# Patient Record
Sex: Female | Born: 1955 | Hispanic: No | Marital: Married | Smoking: Never smoker
Health system: Southern US, Community
[De-identification: ages and names within clinical notes are randomized; demographics above are authoritative.]

## PROBLEM LIST (undated history)

## (undated) DIAGNOSIS — G500 Trigeminal neuralgia: Secondary | ICD-10-CM

## (undated) DIAGNOSIS — R519 Headache, unspecified: Secondary | ICD-10-CM

## (undated) DIAGNOSIS — I10 Essential (primary) hypertension: Secondary | ICD-10-CM

## (undated) HISTORY — DX: Essential (primary) hypertension: I10

## (undated) HISTORY — PX: VAGINAL HYSTERECTOMY: SUR661

## (undated) HISTORY — DX: Trigeminal neuralgia: G50.0

## (undated) HISTORY — DX: Headache, unspecified: R51.9

---

## 1997-11-26 ENCOUNTER — Other Ambulatory Visit: Admission: RE | Admit: 1997-11-26 | Discharge: 1997-11-26 | Payer: Self-pay | Admitting: Obstetrics & Gynecology

## 1999-10-19 ENCOUNTER — Other Ambulatory Visit: Admission: RE | Admit: 1999-10-19 | Discharge: 1999-10-19 | Payer: Self-pay | Admitting: Obstetrics & Gynecology

## 2003-05-20 ENCOUNTER — Other Ambulatory Visit: Admission: RE | Admit: 2003-05-20 | Discharge: 2003-05-20 | Payer: Self-pay | Admitting: Obstetrics & Gynecology

## 2004-04-28 ENCOUNTER — Inpatient Hospital Stay (HOSPITAL_COMMUNITY): Admission: RE | Admit: 2004-04-28 | Discharge: 2004-04-30 | Payer: Self-pay | Admitting: Obstetrics & Gynecology

## 2006-01-10 ENCOUNTER — Ambulatory Visit: Payer: Self-pay | Admitting: Internal Medicine

## 2008-12-21 ENCOUNTER — Encounter: Admission: RE | Admit: 2008-12-21 | Discharge: 2008-12-21 | Payer: Self-pay | Admitting: Family Medicine

## 2009-02-23 ENCOUNTER — Encounter: Admission: RE | Admit: 2009-02-23 | Discharge: 2009-02-23 | Payer: Self-pay | Admitting: Family Medicine

## 2011-04-12 ENCOUNTER — Other Ambulatory Visit: Payer: Self-pay | Admitting: Family Medicine

## 2011-04-12 ENCOUNTER — Ambulatory Visit
Admission: RE | Admit: 2011-04-12 | Discharge: 2011-04-12 | Disposition: A | Discharge status: Home / Self Care | Payer: Managed Care, Other (non HMO) | Source: Ambulatory Visit | Attending: Family Medicine | Admitting: Family Medicine

## 2011-04-12 DIAGNOSIS — T1490XA Injury, unspecified, initial encounter: Secondary | ICD-10-CM

## 2012-04-09 ENCOUNTER — Ambulatory Visit (INDEPENDENT_AMBULATORY_CARE_PROVIDER_SITE_OTHER): Payer: Managed Care, Other (non HMO) | Admitting: Diagnostic Neuroimaging

## 2012-04-09 ENCOUNTER — Encounter: Payer: Self-pay | Admitting: Diagnostic Neuroimaging

## 2012-04-09 VITALS — BP 142/74 | HR 56 | Temp 98.7°F | Ht 64.0 in | Wt 170.0 lb

## 2012-04-09 DIAGNOSIS — G500 Trigeminal neuralgia: Secondary | ICD-10-CM

## 2012-04-09 MED ORDER — CARBAMAZEPINE ER 200 MG PO TB12: 600.0000 mg | ORAL_TABLET | Freq: Two times a day (BID) | ORAL | Status: DC

## 2012-04-09 MED ORDER — BACLOFEN 10 MG PO TABS: 10.0000 mg | ORAL_TABLET | Freq: Three times a day (TID) | ORAL | Status: DC

## 2012-04-09 NOTE — Patient Instructions (Signed)
Continue carbamazepine. Start baclofen dosing as well.

## 2012-04-09 NOTE — Progress Notes (Signed)
[facility]  PATIENT: Kathleen [name] DOB: 30-Jul-1955  REFERRING CLINICIAN:  HISTORY FROM: patient REASON FOR VISIT: follow up   HISTORICAL  CHIEF COMPLAINT:  Chief Complaint  Patient presents with  . Facial Pain    Trigeminal Neuralgia    HISTORY OF PRESENT ILLNESS:   UPDATE 04/09/12: Since last visit, doing about the same. Has not tried baclofen yet. Taking carbamazepine 600 mg twice a day. Still having some mild twinges of numbness and tingling on the right upper lip. No severe painful attacks.  UPDATE 09/04/11: Doing better. Tolerating CBZ 600mg  BID. Uses baclofen prn. No side effects. Mild twinges of aches, but no severe pain.   UPDATE 05/01/11: Doing well. Better on CBZ. Mild cognitive side effects. Fell 4 weeks ago from [employer], broke left foot, but doesn't think it was medication related.   PRIOR HPI: 56 year old right-handed female with history of hypertension, migraine headache, here for evaluation of right facial pain.  Patient has had intermittent episodes of electrical and lightening, shooting pain in her right jaw for over 10 years.  Previous episodes lasted for only a few weeks at a time.  She describes brief, electrical, radiating flashes of pain in her right jaw, up towards her right eye, and deeper in her neck and inside her ear.  She is been evaluated by her dentist numerous times over the years without a specific dental pathology to explain these symptoms.  She does have a history of dental procedures and crowns on her right side.  Since 02/04/2011, patient's symptoms have been more severe and persistent.  Has tried hydrocodone, gabapentin, without relief. She denies any numbness, weakness, slurred speech, vision problems.  She has a first cousin who was diagnosed with multiple sclerosis and is deceased now.  REVIEW OF SYSTEMS: Full 14 system review of systems performed and notable only for weight gain, murmur, ringing in the ears, skin moles,  blurred vision, headache.  ALLERGIES: No Known Allergies  HOME MEDICATIONS: No outpatient prescriptions prior to visit.   No facility-administered medications prior to visit.    PAST MEDICAL HISTORY: Past Medical History  Diagnosis Date  . Hypertension     PAST SURGICAL HISTORY: History reviewed. No pertinent past surgical history.  FAMILY HISTORY: Family History  Problem Relation Age of Onset  . Atrial fibrillation Mother   . Hypertension Mother   . Arthritis Mother   . Heart attack Father   . Bone cancer Other     SOCIAL HISTORY:  History   Social History  . Marital Status: Divorced    Spouse Name: N/A    Number of Children: 1  . Years of Education: HS   Occupational History  .      [employer]   Social History Main Topics  . Smoking status: Never Smoker   . Smokeless tobacco: Not on file  . Alcohol Use: No  . Drug Use: No  . Sexually Active: Not on file   Other Topics Concern  . Not on file   Social History Narrative   Caffeine Use- She drinks coffee and sodas on occasion.     PHYSICAL EXAM  Filed Vitals:   04/09/12 1549  BP: 142/74  Pulse: 56  Temp: 98.7 F (37.1 C)  TempSrc: Oral  Height: 5\' 4"  (1.626 m)  Weight: 170 lb (77.111 kg)   Body mass index is 29.17 kg/(m^2).  GENERAL EXAM: Patient is in no distress  CARDIOVASCULAR: Regular rate and rhythm, no murmurs, no carotid bruits  NEUROLOGIC: MENTAL STATUS: awake, alert, language fluent, comprehension intact, naming intact CRANIAL NERVE: no papilledema on fundoscopic exam, pupils equal and reactive to light, visual fields full to confrontation, extraocular muscles intact, no nystagmus, facial sensation and strength symmetric, uvula midline, shoulder shrug symmetric, tongue midline. MOTOR: normal bulk and tone, full strength in the BUE, BLE SENSORY: normal and symmetric to light touch, pinprick, temperature, vibration and proprioception COORDINATION: finger-nose-finger, fine  finger movements normal REFLEXES: deep tendon reflexes present and symmetric GAIT/STATION: narrow based gait; able to walk on toes, heels and tandem; romberg is negative   DIAGNOSTIC DATA (LABS, IMAGING, TESTING) - I reviewed patient records, labs, notes, testing and imaging myself where available.  No results found for this basename: WBC, HGB, HCT, MCV, PLT   No results found for this basename: na, k, cl, co2, glucose, bun, creatinine, calcium, prot, albumin, ast, alt, alkphos, bilitot, gfrnonaa, gfraa   No results found for this basename: CHOL, HDL, LDLCALC, LDLDIRECT, TRIG, CHOLHDL   No results found for this basename: HGBA1C   No results found for this basename: VITAMINB12   No results found for this basename: TSH   03/01/11 MRI BRAIN - normal   ASSESSMENT AND PLAN  57 y.o. year old female  has a past medical history of Hypertension. here with right trigeminal neuralgia. Doing better on CBZ. Will encourage her to try baclofen to see if symptom management can be improved.   Suanne Marker, MD 04/09/2012, 4:16 PM Certified in Neurology, Neurophysiology and Neuroimaging  [facility] [address] [place], [place] [address] [phone]

## 2012-04-12 ENCOUNTER — Other Ambulatory Visit: Payer: Self-pay | Admitting: Diagnostic Neuroimaging

## 2012-10-04 ENCOUNTER — Other Ambulatory Visit: Payer: Self-pay

## 2012-10-04 DIAGNOSIS — Z1231 Encounter for screening mammogram for malignant neoplasm of breast: Secondary | ICD-10-CM

## 2012-10-22 ENCOUNTER — Telehealth: Payer: Self-pay

## 2012-10-22 NOTE — Telephone Encounter (Signed)
Called patient rescheduled 10/23/12 appt from 2:30 to 1:30. Pt agreed.

## 2012-10-23 ENCOUNTER — Encounter: Payer: Self-pay | Admitting: Diagnostic Neuroimaging

## 2012-10-23 ENCOUNTER — Ambulatory Visit (INDEPENDENT_AMBULATORY_CARE_PROVIDER_SITE_OTHER): Payer: Managed Care, Other (non HMO) | Admitting: Diagnostic Neuroimaging

## 2012-10-23 ENCOUNTER — Ambulatory Visit: Payer: Managed Care, Other (non HMO) | Admitting: Diagnostic Neuroimaging

## 2012-10-23 ENCOUNTER — Encounter (INDEPENDENT_AMBULATORY_CARE_PROVIDER_SITE_OTHER): Payer: Self-pay

## 2012-10-23 VITALS — BP 125/79 | HR 59 | Temp 98.1°F | Ht 64.5 in | Wt 172.0 lb

## 2012-10-23 DIAGNOSIS — G500 Trigeminal neuralgia: Secondary | ICD-10-CM

## 2012-10-23 MED ORDER — CARBAMAZEPINE ER 200 MG PO TB12: 600.0000 mg | ORAL_TABLET | Freq: Two times a day (BID) | ORAL | Status: DC

## 2012-10-23 NOTE — Progress Notes (Signed)
[facility]  PATIENT: Kathleen [name] DOB: Jan 18, 1955  REFERRING CLINICIAN:  HISTORY FROM: patient REASON FOR VISIT: follow up   HISTORICAL  CHIEF COMPLAINT:  Chief Complaint  Patient presents with  . Follow-up    Trigeminal neuralgia # 6    HISTORY OF PRESENT ILLNESS:   UPDATE 10/23/12: Since last visit, doing abou the same. Baclofen 10mg  BID did not help that much more than carbamazepine 600mg  BID alone. Migraines worse for a while, but better with starting HCTZ and reducing Excedrin use.   UPDATE 04/09/12: Since last visit, doing about the same. Has not tried baclofen yet. Taking carbamazepine 600 mg twice a day. Still having some mild twinges of numbness and tingling on the right upper lip. No severe painful attacks.  UPDATE 09/04/11: Doing better. Tolerating CBZ 600mg  BID. Uses baclofen prn. No side effects. Mild twinges of aches, but no severe pain.   UPDATE 05/01/11: Doing well. Better on CBZ. Mild cognitive side effects. Fell 4 weeks ago from [employer], broke left foot, but doesn't think it was medication related.   PRIOR HPI: 57 year old right-handed female with history of hypertension, migraine headache, here for evaluation of right facial pain.  Patient has had intermittent episodes of electrical and lightening, shooting pain in her right jaw for over 10 years.  Previous episodes lasted for only a few weeks at a time.  She describes brief, electrical, radiating flashes of pain in her right jaw, up towards her right eye, and deeper in her neck and inside her ear.  She is been evaluated by her dentist numerous times over the years without a specific dental pathology to explain these symptoms.  She does have a history of dental procedures and crowns on her right side.  Since 02/04/2011, patient's symptoms have been more severe and persistent.  Has tried hydrocodone, gabapentin, without relief. She denies any numbness, weakness, slurred speech, vision problems.   She has a first cousin who was diagnosed with multiple sclerosis and is deceased now.  REVIEW OF SYSTEMS: Full 14 system review of systems performed and notable only for headaches, right facial pain.  ALLERGIES: Allergies  Allergen Reactions  . Morphine And Related   . Penicillins     HOME MEDICATIONS: Outpatient Prescriptions Prior to Visit  Medication Sig Dispense Refill  . Biotin 1000 MCG tablet Take 1,000 mcg by mouth daily.       . calcium carbonate (OS-CAL) 600 MG TABS Take 600 mg by mouth 2 (two) times daily with a meal.      . HYDROcodone-acetaminophen (NORCO/VICODIN) 5-325 MG per tablet as needed.      . nadolol (CORGARD) 40 MG tablet Take 1 tablet by mouth daily.      . Omega-3 Fatty Acids (FISH OIL) 1200 MG CAPS Take by mouth.      . SUMAtriptan (IMITREX) 100 MG tablet as needed.      . Vitamin D, Ergocalciferol, (DRISDOL) 50000 UNITS CAPS Take 50,000 Units by mouth once a week.      . baclofen (LIORESAL) 10 MG tablet Take 1 tablet (10 mg total) by mouth 3 (three) times daily.  90 each  12  . carbamazepine (TEGRETOL XR) 200 MG 12 hr tablet TAKE THREE TABLETS  BY MOUTH TWICE DAILY  180 tablet  12   No facility-administered medications prior to visit.    PAST MEDICAL HISTORY: Past Medical History  Diagnosis Date  . Hypertension   . Trigeminal neuralgia     PAST SURGICAL HISTORY:  No past surgical history on file.  FAMILY HISTORY: Family History  Problem Relation Age of Onset  . Atrial fibrillation Mother   . Hypertension Mother   . Arthritis Mother   . Heart attack Father   . Bone cancer Other     SOCIAL HISTORY:  History   Social History  . Marital Status: Divorced    Spouse Name: N/A    Number of Children: 1  . Years of Education: HS   Occupational History  .      [employer]   Social History Main Topics  . Smoking status: Never Smoker   . Smokeless tobacco: Not on file  . Alcohol Use: No  . Drug Use: No  . Sexual Activity: No   Other  Topics Concern  . Not on file   Social History Narrative   Caffeine Use- She drinks coffee and sodas on occasion.     PHYSICAL EXAM  Filed Vitals:   10/23/12 1253  BP: 125/79  Pulse: 59  Temp: 98.1 F (36.7 C)  TempSrc: Oral  Height: 5' 4.5" (1.638 m)  Weight: 172 lb (78.019 kg)   Body mass index is 29.08 kg/(m^2).  GENERAL EXAM: Patient is in no distress  CARDIOVASCULAR: Regular rate and rhythm, no murmurs, no carotid bruits  NEUROLOGIC: MENTAL STATUS: awake, alert, language fluent, comprehension intact, naming intact CRANIAL NERVE: pupils equal and reactive to light, visual fields full to confrontation, extraocular muscles intact, no nystagmus, facial sensation and strength symmetric, uvula midline, shoulder shrug symmetric, tongue midline. MOTOR: normal bulk and tone, full strength in the BUE, BLE SENSORY: normal and symmetric to light touch COORDINATION: finger-nose-finger, fine finger movements normal GAIT/STATION: narrow based gait   DIAGNOSTIC DATA (LABS, IMAGING, TESTING) - I reviewed patient records, labs, notes, testing and imaging myself where available.  No results found for this basename: WBC,  HGB,  HCT,  MCV,  PLT   No results found for this basename: na,  k,  cl,  co2,  glucose,  bun,  creatinine,  calcium,  prot,  albumin,  ast,  alt,  alkphos,  bilitot,  gfrnonaa,  gfraa   No results found for this basename: CHOL,  HDL,  LDLCALC,  LDLDIRECT,  TRIG,  CHOLHDL   No results found for this basename: HGBA1C   No results found for this basename: VITAMINB12   No results found for this basename: TSH    03/01/11 MRI BRAIN - normal   ASSESSMENT AND PLAN  57 y.o. year old female  has a past medical history of Hypertension and Trigeminal neuralgia. here with right trigeminal neuralgia and migraine headaches.   PLAN: 1. Continue CBZ 600mg  BID 2. Monitor migraines; may consider topiramate or amitriptyline if they get worse  Return in about 6 months  (around 04/23/2013) for with Heide Guile or Penumalli.   Suanne Marker, MD 10/23/2012, 1:30 PM Certified in Neurology, Neurophysiology and Neuroimaging  [facility] [address] [place], [place] [address] [phone]

## 2012-10-23 NOTE — Patient Instructions (Signed)
Taper baclofen off because it is not helping.

## 2012-10-25 ENCOUNTER — Ambulatory Visit
Admission: RE | Admit: 2012-10-25 | Discharge: 2012-10-25 | Disposition: A | Discharge status: Home / Self Care | Payer: Managed Care, Other (non HMO) | Source: Ambulatory Visit

## 2012-10-25 DIAGNOSIS — Z1231 Encounter for screening mammogram for malignant neoplasm of breast: Secondary | ICD-10-CM

## 2013-04-24 ENCOUNTER — Ambulatory Visit (INDEPENDENT_AMBULATORY_CARE_PROVIDER_SITE_OTHER): Payer: Managed Care, Other (non HMO) | Admitting: Nurse Practitioner

## 2013-04-24 ENCOUNTER — Encounter: Payer: Self-pay | Admitting: Nurse Practitioner

## 2013-04-24 ENCOUNTER — Encounter (INDEPENDENT_AMBULATORY_CARE_PROVIDER_SITE_OTHER): Payer: Self-pay

## 2013-04-24 VITALS — BP 127/78 | HR 71 | Wt 188.0 lb

## 2013-04-24 DIAGNOSIS — G500 Trigeminal neuralgia: Secondary | ICD-10-CM

## 2013-04-24 DIAGNOSIS — G43109 Migraine with aura, not intractable, without status migrainosus: Secondary | ICD-10-CM | POA: Insufficient documentation

## 2013-04-24 MED ORDER — TOPIRAMATE 25 MG PO TABS: 25.0000 mg | ORAL_TABLET | Freq: Every day | ORAL | Status: DC

## 2013-04-24 NOTE — Patient Instructions (Signed)
Continue Carbamazepine at current dose.  Start Topirimate for Migraine Prevention, 25 mg , 1 tablet daily at bedtime.  Side effects may be tingling in fingers, around mouth, or in feet.  There may be decreased appetite and carbinated drinks may taste funny.  If the side effects are not tolerable, you may discontinue this dose without tapering.  Please keep headache diary and bring to the next visit. Follow up in 2 months, sooner as needed.

## 2013-04-24 NOTE — Progress Notes (Signed)
PATIENT: Kathleen MunroeDebbie S [name] DOB: 02/21/1955  REASON FOR VISIT: follow up for trigeminal neuralgia, Migraine HISTORY FROM: patient  HISTORY OF PRESENT ILLNESS: UPDATE 04/24/13 (LL):  Since last visit, trigeminal neuralgia is stable on CBZ, but when dose is wearing off she feels "vibration" sensation sometimes on right side of mouth.  Migraines are worse, averaging 3-4 days per week.  Sometimes wakes up with one in morning, sometimes comes on later in day.  Most often bi-temporal or behind eyes.  Sometimes in occipital region.  At worst with nausea and vomiting.  Positive for hyperosmia.  Migraines last 4-6 hours.  Sometimes relived with Excedrin.  Imitrex will stop severe headache in usually an hour.    UPDATE 10/23/12: Since last visit, doing about the same. Baclofen 10mg  BID did not help that much more than carbamazepine 600mg  BID alone. Migraines worse for a while, but better with starting HCTZ and reducing Excedrin use.  UPDATE 04/09/12: Since last visit, doing about the same. Has not tried baclofen yet. Taking carbamazepine 600 mg twice a day. Still having some mild twinges of numbness and tingling on the right upper lip. No severe painful attacks.  UPDATE 09/04/11: Doing better. Tolerating CBZ 600mg  BID. Uses baclofen prn. No side effects. Mild twinges of aches, but no severe pain.  UPDATE 05/01/11: Doing well. Better on CBZ. Mild cognitive side effects. Fell 4 weeks ago from [employer], broke left foot, but doesn't think it was medication related.  PRIOR HPI: 58 year old right-handed female with history of hypertension, migraine headache, here for evaluation of right facial pain.  Patient has had intermittent episodes of electrical and lightening, shooting pain in her right jaw for over 10 years. Previous episodes lasted for only a few weeks at a time. She describes brief, electrical, radiating flashes of pain in her right jaw, up towards her right eye, and deeper in her neck and inside her ear. She is  been evaluated by her dentist numerous times over the years without a specific dental pathology to explain these symptoms. She does have a history of dental procedures and crowns on her right side.  Since 02/04/2011, patient's symptoms have been more severe and persistent. Has tried hydrocodone, gabapentin, without relief. She denies any numbness, weakness, slurred speech, vision problems. She has a first cousin who was diagnosed with multiple sclerosis and is deceased now.   REVIEW OF SYSTEMS: Full 14 system review of systems performed and notable only for headaches, weight gain, ringing in ears, joint pain.   ALLERGIES: Allergies  Allergen Reactions  . Morphine And Related   . Penicillins     HOME MEDICATIONS: Outpatient Prescriptions Prior to Visit  Medication Sig Dispense Refill  . Biotin 1000 MCG tablet Take 1,000 mcg by mouth daily.       . calcium carbonate (OS-CAL) 600 MG TABS Take 600 mg by mouth 2 (two) times daily with a meal.      . carbamazepine (TEGRETOL XR) 200 MG 12 hr tablet Take 3 tablets (600 mg total) by mouth 2 (two) times daily.  180 tablet  12  . hydrochlorothiazide (MICROZIDE) 12.5 MG capsule Take 12.5 mg by mouth daily.      . nadolol (CORGARD) 40 MG tablet Take 1 tablet by mouth daily.      . Omega-3 Fatty Acids (FISH OIL) 1200 MG CAPS Take by mouth.      . SUMAtriptan (IMITREX) 100 MG tablet as needed.      . baclofen (LIORESAL) 10 MG tablet  Take 10 mg by mouth 2 (two) times daily.      Marland Kitchen. HYDROcodone-acetaminophen (NORCO/VICODIN) 5-325 MG per tablet as needed.      . Vitamin D, Ergocalciferol, (DRISDOL) 50000 UNITS CAPS Take 50,000 Units by mouth once a week.       No facility-administered medications prior to visit.   PHYSICAL EXAM  Filed Vitals:   04/24/13 1346  BP: 127/78  Pulse: 71  Weight: 188 lb (85.276 kg)   Body mass index is 31.78 kg/(m^2).  Generalized: Well developed, in no acute distress  Head: normocephalic and atraumatic. Oropharynx  benign  Neck: Supple, no carotid bruits  Cardiac: Regular rate rhythm, no murmur  Musculoskeletal: No deformity   NEUROLOGIC:  MENTAL STATUS: awake, alert, language fluent, comprehension intact, naming intact  CRANIAL NERVE: pupils equal and reactive to light, visual fields full to confrontation, extraocular muscles intact, no nystagmus, facial sensation and strength symmetric, uvula midline, shoulder shrug symmetric, tongue midline.  MOTOR: normal bulk and tone, full strength in the BUE, BLE  SENSORY: normal and symmetric to light touch  COORDINATION: finger-nose-finger, fine finger movements normal  GAIT/STATION: narrow based gait  ASSESSMENT AND PLAN 58 y.o. year old female has a past medical history of Hypertension and Trigeminal neuralgia here with right trigeminal neuralgia (stable) and migraine headaches with aura, averaging 3-4 headache days per week.  PLAN: 1. Continue CBZ 600mg  BID  2. Start topiramate 25 mg at bedtime for Migraine prevention.  Possible SE reviewed.Headache diary handout given--bring to next visit. 3. Follow up in 2 months, sooner as needed.  Meds ordered this encounter  Medications  . topiramate (TOPAMAX) 25 MG tablet    Sig: Take 1 tablet (25 mg total) by mouth at bedtime.    Dispense:  30 tablet    Refill:  2    Order Specific Question:  Supervising Provider    Answer:  Micki RileySETHI, PRAMOD S [[id number]]   Return in about 2 months (around 06/24/2013).  Kathleen FearLYNN E. Khaalid Lefkowitz, MSN, NP-C 04/24/2013, 2:28 PM [facility] [address], [address] [place], [place] [address] [phone]  Note: This document was prepared with digital dictation and possible smart phrase technology. Any transcriptional errors that result from this process are unintentional.

## 2013-04-29 NOTE — Progress Notes (Signed)
I reviewed note and agree with plan.   VIKRAM R. PENUMALLI, MD 04/29/2013, 5:52 PM Certified in Neurology, Neurophysiology and Neuroimaging  [facility] [address], [address] , [place] [address] [phone]  

## 2013-06-27 ENCOUNTER — Ambulatory Visit: Payer: Managed Care, Other (non HMO) | Admitting: Nurse Practitioner

## 2013-07-10 ENCOUNTER — Encounter: Payer: Self-pay | Admitting: Nurse Practitioner

## 2013-07-10 ENCOUNTER — Ambulatory Visit (INDEPENDENT_AMBULATORY_CARE_PROVIDER_SITE_OTHER): Payer: Managed Care, Other (non HMO) | Admitting: Nurse Practitioner

## 2013-07-10 ENCOUNTER — Encounter (INDEPENDENT_AMBULATORY_CARE_PROVIDER_SITE_OTHER): Payer: Self-pay

## 2013-07-10 VITALS — BP 135/86 | HR 64 | Temp 97.8°F | Ht 62.5 in | Wt 173.0 lb

## 2013-07-10 DIAGNOSIS — G500 Trigeminal neuralgia: Secondary | ICD-10-CM

## 2013-07-10 DIAGNOSIS — G43109 Migraine with aura, not intractable, without status migrainosus: Secondary | ICD-10-CM

## 2013-07-10 NOTE — Progress Notes (Signed)
PATIENT: Kathleen MunroeDebbie S Levels DOB: 12/09/1955  REASON FOR VISIT: routine follow up for trigeminal neuralgia, Migraines HISTORY FROM: patient  HISTORY OF PRESENT ILLNESS: UPDATE 07/10/13 (LL):  Since last visit, no facial pain or vibration.  She thinks Topamax makes her more tired.  Having just as many headaches, still 3-4 days per week.  Sumatriptan helpful, takes Excedrin for milder headaches.  Would like to wean off some medication if possible.  UPDATE 04/24/13 (LL): Since last visit, trigeminal neuralgia is stable on CBZ, but when dose is wearing off she feels "vibration" sensation sometimes on right side of mouth. Migraines are worse, averaging 3-4 days per week. Sometimes wakes up with one in morning, sometimes comes on later in day. Most often bi-temporal or behind eyes. Sometimes in occipital region. At worst with nausea and vomiting. Positive for hyperosmia. Migraines last 4-6 hours. Sometimes relived with Excedrin. Imitrex will stop severe headache in usually an hour.  UPDATE 10/23/12: Since last visit, doing about the same. Baclofen 10mg  BID did not help that much more than carbamazepine 600mg  BID alone. Migraines worse for a while, but better with starting HCTZ and reducing Excedrin use.  UPDATE 04/09/12: Since last visit, doing about the same. Has not tried baclofen yet. Taking carbamazepine 600 mg twice a day. Still having some mild twinges of numbness and tingling on the right upper lip. No severe painful attacks.  UPDATE 09/04/11: Doing better. Tolerating CBZ 600mg  BID. Uses baclofen prn. No side effects. Mild twinges of aches, but no severe pain.  UPDATE 05/01/11: Doing well. Better on CBZ. Mild cognitive side effects. F[place] [facility] weeks ago [facility], broke left foot, but doesn't think it was medication related.  PRIOR HPI: 58 year old right-handed female with history of hypertension, migraine headache, here for evaluation of right facial pain.  Patient has had intermittent episodes of electrical  and lightening, shooting pain in her right jaw for over 10 years. Previous episodes lasted for only a few weeks at a time. She describes brief, electrical, radiating flashes of pain in her right jaw, up towards her right eye, and deeper in her neck and inside her ear. She is been evaluated by her dentist numerous times over the years without a specific dental pathology to explain these symptoms. She does have a history of dental procedures and crowns on her right side.  Since 02/04/2011, patient's symptoms have been more severe and persistent. Has tried hydrocodone, gabapentin, without relief. She denies any numbness, weakness, slurred speech, vision problems. She has a first cousin who was diagnosed with multiple sclerosis and is deceased now.   REVIEW OF SYSTEMS: Full 14 system review of systems performed and notable only for headaches, ringing in ears.   ALLERGIES: Allergies  Allergen Reactions  . Morphine And Related   . Penicillins     HOME MEDICATIONS: Outpatient Prescriptions Prior to Visit  Medication Sig Dispense Refill  . Biotin 1000 MCG tablet Take 1,000 mcg by mouth daily.       . calcium carbonate (OS-CAL) 600 MG TABS Take 600 mg by mouth 2 (two) times daily with a meal.      . carbamazepine (TEGRETOL XR) 200 MG 12 hr tablet Take 3 tablets (600 mg total) by mouth 2 (two) times daily.  180 tablet  12  . hydrochlorothiazide (MICROZIDE) 12.5 MG capsule Take 12.5 mg by mouth daily.      . nadolol (CORGARD) 40 MG tablet Take 1 tablet by mouth daily.      .Marland Kitchen  Omega-3 Fatty Acids (FISH OIL) 1200 MG CAPS Take by mouth.      . SUMAtriptan (IMITREX) 100 MG tablet as needed.      . topiramate (TOPAMAX) 25 MG tablet Take 1 tablet (25 mg total) by mouth at bedtime.  30 tablet  2   No facility-administered medications prior to visit.    PHYSICAL EXAM Filed Vitals:   07/10/13 1141  BP: 135/86  Pulse: 64  Temp: 97.8 F (36.6 C)  TempSrc: Oral  Height: 5' 2.5" (1.588 m)  Weight: 173 lb  (78.472 kg)   Body mass index is 31.12 kg/(m^2). No exam data present  Generalized: Well developed, in no acute distress  Head: normocephalic and atraumatic. Oropharynx benign  Neck: Supple, no carotid bruits  Cardiac: Regular rate rhythm, no murmur  Musculoskeletal: No deformity   Neurological examination  Mentation: Alert oriented to time, place, history taking. Follows all commands speech and language fluent Cranial nerve II-XII: Fundoscopic exam not done. Pupils were equal round reactive to light extraocular movements were full, visual field were full on confrontational test. Facial sensation and strength were normal. hearing was intact to finger rubbing bilaterally. Uvula tongue midline. head turning and shoulder shrug and were normal and symmetric.Tongue protrusion into cheek strength was normal. Motor: The motor testing reveals 5 over 5 strength of all 4 extremities. Good symmetric motor tone is noted throughout.  Sensory: Sensory testing is intact to soft touch on all 4 extremities. No evidence of extinction is noted.  Coordination: Cerebellar testing reveals good finger-nose-finger and heel-to-shin bilaterally.  Gait and station: Gait is normal. Tandem gait is normal. Romberg is negative. Reflexes: Deep tendon reflexes are symmetric and normal bilaterally.    ASSESSMENT: 58 y.o. year old female has a past medical history of Hypertension and Trigeminal neuralgia here with right trigeminal neuralgia (stable) and migraine headaches with aura, averaging 3-4 headache days per week. No improvement on Topamax.  Discussed possible Botox injections in the future, brochure given.  PLAN: Stop Topamax, as this has not helped with the headaches and makes you tired. After 1 week off of Topamax, if no new face pain or "twinges", decrease Carbamazepine by 1 tablet in the morning.If stable after 1 week, decrease another tablet at night, so you are taking 2 in the am and 2 in the pm. Discussed that  if face pain came back, she can go back to previous dose. Follow up in 3 months, sooner as needed.   Ronal FearLYNN E. LAM, MSN, NP-C 07/10/2013, 12:37 PM [facility] [address], [address] [place], [place] [address] [phone]  Note: This document was prepared with digital dictation and possible smart phrase technology. Any transcriptional errors that result from this process are unintentional.

## 2013-07-10 NOTE — Patient Instructions (Signed)
Stop Topamax, as this has not helped with the headaches and makes you tired.  After 1 week off of Topamax, if no new face pain or "twinges", decrease Carbamazepine by 1 tablet in the morning.  If stable after 1 week, decrease another tablet at night, so you are taking 2 in the am and 2 in the pm.  Follow up in 3 months, sooner as needed.   Trigeminal Neuralgia Trigeminal neuralgia is a nerve disorder that causes sudden attacks of severe facial pain. It is caused by damage to the trigeminal nerve, a major nerve in the face. It is more common in women and in the elderly, although it can also happen in younger patients. Attacks last from a few seconds to several minutes and can occur from a couple of times per year to several times per day. Trigeminal neuralgia can be a very distressing and disabling condition. Surgery may be needed in very severe cases if medical treatment does not give relief. HOME CARE INSTRUCTIONS   If your caregiver prescribed medication to help prevent attacks, take as directed.  To help prevent attacks:  Chew on the unaffected side of the mouth.  Avoid touching your face.  Avoid blasts of hot or cold air.  Men may wish to grow a beard to avoid having to shave. SEEK IMMEDIATE MEDICAL CARE IF:  Pain is unbearable and your medicine does not help.  You develop new, unexplained symptoms (problems).  You have problems that may be related to a medication you are taking. Document Released: 12/24/1999 Document Revised: 03/20/2011 Document Reviewed: 10/23/2008 [facility] Patient Information 2015 [place], [place]. This information is not intended to replace advice given to you by your health care provider. Make sure you discuss any questions you have with your health care provider.   Recurrent Migraine Headache A migraine headache is an intense, throbbing pain on one or both sides of your head. Recurrent migraines keep coming back. A migraine can last for 30 minutes to  several hours. CAUSES  The exact cause of a migraine headache is not always known. However, a migraine may be caused when nerves in the brain become irritated and release chemicals that cause inflammation. This causes pain. Certain things may also trigger migraines, such as:   Alcohol.  Smoking.  Stress.  Menstruation.  Aged cheeses.  Foods or drinks that contain nitrates, glutamate, aspartame, or tyramine.  Lack of sleep.  Chocolate.  Caffeine.  Hunger.  Physical exertion.  Fatigue.  Medicines used to treat chest pain (nitroglycerine), birth control pills, estrogen, and some blood pressure medicines.  SYMPTOMS   Pain on one or both sides of your head.  Pulsating or throbbing pain.  Severe pain that prevents daily activities.  Pain that is aggravated by any physical activity.  Nausea, vomiting, or both.  Dizziness.  Pain with exposure to bright lights, loud noises, or activity.  General sensitivity to bright lights, loud noises, or smells. Before you get a migraine, you may get warning signs that a migraine is coming (aura). An aura may include:  Seeing flashing lights.  Seeing bright spots, halos, or zig-zag lines.  Having tunnel vision or blurred vision.  Having feelings of numbness or tingling.  Having trouble talking.  Having muscle weakness. DIAGNOSIS  A recurrent migraine headache is often diagnosed based on:  Symptoms.  Physical examination.  A CT scan or MRI of your head. These imaging tests cannot diagnose migraines but can help rule out other causes of headaches.  TREATMENT  Medicines may be given for pain and nausea. Medicines can also be given to help prevent recurrent migraines. HOME CARE INSTRUCTIONS  Only take over-the-counter or prescription medicines for pain or discomfort as directed by your health care provider. The use of long-term narcotics is not recommended.  Lie down in a dark, quiet room when you have a  migraine.  Keep a journal to find out what may trigger your migraine headaches. For example, write down:  What you eat and drink.  How much sleep you get.  Any change to your diet or medicines.  Limit alcohol consumption.  Quit smoking if you smoke.  Get 7-9 hours of sleep, or as recommended by your health care provider.  Limit stress.  Keep lights dim if bright lights bother you and make your migraines worse. SEEK MEDICAL CARE IF:   You do not get relief from the medicines given to you.  You have a recurrence of pain.  You have a fever. SEEK IMMEDIATE MEDICAL CARE IF:  Your migraine becomes severe.  You have a stiff neck.  You have loss of vision.  You have muscular weakness or loss of muscle control.  You start losing your balance or have trouble walking.  You feel faint or pass out.  You have severe symptoms that are different from your first symptoms. MAKE SURE YOU:   Understand these instructions.  Will watch your condition.  Will get help right away if you are not doing well or get worse. Document Released: 09/20/2000 Document Revised: 12/31/2012 Document Reviewed: 09/02/2012 [facility] Patient Information 2015 [place], [place]. This information is not intended to replace advice given to you by your health care provider. Make sure you discuss any questions you have with your health care provider.

## 2013-07-17 NOTE — Progress Notes (Signed)
I reviewed note and agree with plan.   VIKRAM R. PENUMALLI, MD  Certified in Neurology, Neurophysiology and Neuroimaging  [facility] [address], [address] [place], [place] [address] [phone]   

## 2013-10-09 ENCOUNTER — Ambulatory Visit (INDEPENDENT_AMBULATORY_CARE_PROVIDER_SITE_OTHER): Payer: Managed Care, Other (non HMO) | Admitting: Nurse Practitioner

## 2013-10-09 ENCOUNTER — Encounter: Payer: Self-pay | Admitting: Nurse Practitioner

## 2013-10-09 ENCOUNTER — Encounter (INDEPENDENT_AMBULATORY_CARE_PROVIDER_SITE_OTHER): Payer: Self-pay

## 2013-10-09 VITALS — BP 128/80 | HR 80 | Temp 98.1°F | Ht 63.0 in | Wt 176.0 lb

## 2013-10-09 DIAGNOSIS — G44229 Chronic tension-type headache, not intractable: Secondary | ICD-10-CM

## 2013-10-09 DIAGNOSIS — G43109 Migraine with aura, not intractable, without status migrainosus: Secondary | ICD-10-CM

## 2013-10-09 DIAGNOSIS — G500 Trigeminal neuralgia: Secondary | ICD-10-CM

## 2013-10-09 MED ORDER — CARBAMAZEPINE ER 200 MG PO TB12: 400.0000 mg | ORAL_TABLET | Freq: Two times a day (BID) | ORAL | Status: DC

## 2013-10-09 NOTE — Patient Instructions (Addendum)
Continue Carbamazepine 2 tablets in the am and 2 in the pm. (400 mg/400 mg). Continue Sumatriptan for severe Migraine, reduce use of Excedrin for milder headaches to avoid rebound headache. Follow up in 6 months, sooner as needed.

## 2013-10-09 NOTE — Progress Notes (Signed)
PATIENT: Kathleen MunroeDebbie S Bensch DOB: 07/13/1955  REASON FOR VISIT: routine follow up for trigeminal neuralgia, Migraine HISTORY FROM: patient  HISTORY OF PRESENT ILLNESS: UPDATE 10/09/13 (LL): Since last visit, she weaned off Topamax.  She does not notice any change in the frequency of headaches. States she continues to have 4 out of 7 days with a tension type headache.  She usually takes Excedrin on these days. Reduced her CBZ to 400 mg twice daily. Having less frequent severe Migraines, maybe 1-2 per month, in which she takes Imitrex for relief.  Sometimes takes 2 doses to feel better.  UPDATE 07/10/13 (LL): Since last visit, no facial pain or vibration. She thinks Topamax makes her more tired. Having just as many headaches, still 3-4 days per week. Sumatriptan helpful, takes Excedrin for milder headaches. Would like to wean off some medication if possible.  UPDATE 04/24/13 (LL): Since last visit, trigeminal neuralgia is stable on CBZ, but when dose is wearing off she feels "vibration" sensation sometimes on right side of mouth. Migraines are worse, averaging 3-4 days per week. Sometimes wakes up with one in morning, sometimes comes on later in day. Most often bi-temporal or behind eyes. Sometimes in occipital region. At worst with nausea and vomiting. Positive for hyperosmia. Migraines last 4-6 hours. Sometimes relived with Excedrin. Imitrex will stop severe headache in usually an hour.  UPDATE 10/23/12: Since last visit, doing about the same. Baclofen 10mg  BID did not help that much more than carbamazepine 600mg  BID alone. Migraines worse for a while, but better with starting HCTZ and reducing Excedrin use.  UPDATE 04/09/12: Since last visit, doing about the same. Has not tried baclofen yet. Taking carbamazepine 600 mg twice a day. Still having some mild twinges of numbness and tingling on the right upper lip. No severe painful attacks.  UPDATE 09/04/11: Doing better. Tolerating CBZ 600mg  BID. Uses baclofen  prn. No side effects. Mild twinges of aches, but no severe pain.  UPDATE 05/01/11: Doing well. Better on CBZ. Mild cognitive side effects. Fell 4 weeks ago [place] [facility], broke left foot, but doesn't think it was medication related.  PRIOR HPI: 58 year old right-handed female with history of hypertension, migraine headache, here for evaluation of right facial pain.  Patient has had intermittent episodes of electrical and lightening, shooting pain in her right jaw for over 10 years. Previous episodes lasted for only a few weeks at a time. She describes brief, electrical, radiating flashes of pain in her right jaw, up towards her right eye, and deeper in her neck and inside her ear. She is been evaluated by her dentist numerous times over the years without a specific dental pathology to explain these symptoms. She does have a history of dental procedures and crowns on her right side.  Since 02/04/2011, patient's symptoms have been more severe and persistent. Has tried hydrocodone, gabapentin, without relief. She denies any numbness, weakness, slurred speech, vision problems. She has a first cousin who was diagnosed with multiple sclerosis and is deceased now.   REVIEW OF SYSTEMS: Full 14 system review of systems performed and notable only for headaches, cough, murmur  ALLERGIES: Allergies  Allergen Reactions  . Morphine And Related   . Penicillins     HOME MEDICATIONS: Outpatient Prescriptions Prior to Visit  Medication Sig Dispense Refill  . Biotin 1000 MCG tablet Take 1,000 mcg by mouth daily.       . calcium carbonate (OS-CAL) 600 MG TABS Take 600 mg by mouth 2 (two) times  daily with a meal.      . hydrochlorothiazide (MICROZIDE) 12.5 MG capsule Take 12.5 mg by mouth daily.      . nadolol (CORGARD) 40 MG tablet Take 1 tablet by mouth daily.      . Omega-3 Fatty Acids (FISH OIL) 1200 MG CAPS Take by mouth.      . SUMAtriptan (IMITREX) 100 MG tablet as needed.      . Vitamin D, Ergocalciferol,  (DRISDOL) 50000 UNITS CAPS capsule Take 1 capsule by mouth once a week.      . carbamazepine (TEGRETOL XR) 200 MG 12 hr tablet Take 3 tablets (600 mg total) by mouth 2 (two) times daily.  180 tablet  12  . topiramate (TOPAMAX) 25 MG tablet Take 1 tablet (25 mg total) by mouth at bedtime.  30 tablet  2   No facility-administered medications prior to visit.    PHYSICAL EXAM Filed Vitals:   10/09/13 1322  BP: 128/80  Pulse: 80  Temp: 98.1 F (36.7 C)  TempSrc: Oral  Height: 5\' 3"  (1.6 m)  Weight: 176 lb (79.833 kg)   Body mass index is 31.18 kg/(m^2).  Generalized: Well developed, in no acute distress  Head: normocephalic and atraumatic. Oropharynx benign  Neck: Supple, no carotid bruits  Cardiac: Regular rate rhythm, 2/6 systolic murmur Musculoskeletal: No deformity   Neurological examination  Mentation: Alert oriented to time, place, history taking. Follows all commands speech and language fluent  Cranial nerve II-XII: Fundoscopic exam not done. Pupils were equal round reactive to light extraocular movements were full, visual field were full on confrontational test. Facial sensation and strength were normal. hearing was intact to finger rubbing bilaterally. Uvula tongue midline. head turning and shoulder shrug and were normal and symmetric.Tongue protrusion into cheek strength was normal.  Motor: The motor testing reveals 5 over 5 strength of all 4 extremities. Good symmetric motor tone is noted throughout.  Sensory: Sensory testing is intact to soft touch on all 4 extremities. No evidence of extinction is noted.  Coordination: Cerebellar testing reveals good finger-nose-finger and heel-to-shin bilaterally.  Gait and station: Gait is normal. Tandem gait is normal. Romberg is negative.  Reflexes: Deep tendon reflexes are symmetric and normal bilaterally.   ASSESSMENT: 58 y.o. 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 No improvement on Topamax.   PLAN: Continue Carbamazepine 2 tablets in the am and 2 in the pm. (400 mg/400 mg). Continue Sumatriptan for severe Migraine, reduce use of Excedrin for milder headaches to avoid rebound headache. Follow up in 6 months, sooner as needed.   Meds ordered this encounter  Medications  . carbamazepine (TEGRETOL XR) 200 MG 12 hr tablet    Sig: Take 600 mg by mouth 4 (four) times daily. 2 am , and 2 pm   LYNN E. LAM, MSN, FNP-BC, A/GNP-C 10/09/2013, 1:30 PM [facility] [address] [place], [place] [phone]  Note: This document was prepared with digital dictation and possible smart phrase technology. Any transcriptional errors that result from this process are unintentional.

## 2013-10-10 NOTE — Progress Notes (Signed)
I reviewed note and agree with plan.   Suanne MarkerVIKRAM R. PENUMALLI, MD 10/10/2013, 4:36 PM Certified in Neurology, Neurophysiology and Neuroimaging  [facility] [address], [address] [place], [place] [address] [phone]

## 2014-04-16 ENCOUNTER — Ambulatory Visit (INDEPENDENT_AMBULATORY_CARE_PROVIDER_SITE_OTHER): Payer: Managed Care, Other (non HMO) | Admitting: Diagnostic Neuroimaging

## 2014-04-16 ENCOUNTER — Encounter: Payer: Self-pay | Admitting: Diagnostic Neuroimaging

## 2014-04-16 VITALS — BP 154/90 | HR 64 | Ht 63.0 in | Wt 177.6 lb

## 2014-04-16 DIAGNOSIS — G43109 Migraine with aura, not intractable, without status migrainosus: Secondary | ICD-10-CM | POA: Diagnosis not present

## 2014-04-16 DIAGNOSIS — G44221 Chronic tension-type headache, intractable: Secondary | ICD-10-CM | POA: Diagnosis not present

## 2014-04-16 DIAGNOSIS — G500 Trigeminal neuralgia: Secondary | ICD-10-CM

## 2014-04-16 MED ORDER — AMITRIPTYLINE HCL 25 MG PO TABS: 25.0000 mg | ORAL_TABLET | Freq: Every day | ORAL | Status: DC

## 2014-04-16 NOTE — Patient Instructions (Signed)
Try increasing physical activity.  Try amitriptyline 25mg  at bedtime.

## 2014-04-16 NOTE — Progress Notes (Signed)
PATIENT: Kathleen [name] DOB: 08-30-1955  REASON FOR VISIT: routine follow up for trigeminal neuralgia, Migraine HISTORY FROM: patient  Chief Complaint  Patient presents with  . Follow-up    trigeminal neuralgia    HISTORY OF PRESENT ILLNESS:  UPDATE 04/16/14 (VRP): Since last visit, continues to have 3-4 tension HA per week, 0-1 migraine per month. Right trigeminal neuralgia pain is resolved, stable on CBZ  BID. Some ongoing stress. No other triggers. Left knee and hip pain limits physical activity.  UPDATE 10/09/13 (LL): Since last visit, she weaned off Topamax.  She does not notice any change in the frequency of headaches. States she continues to have 4 out of 7 days with a tension type headache.  She usually takes Excedrin on these days. Reduced her CBZ to 400 mg twice daily. Having less frequent severe Migraines, maybe 1-2 per month, in which she takes Imitrex for relief.  Sometimes takes 2 doses to feel better.  UPDATE 07/10/13 (LL): Since last visit, no facial pain or vibration. She thinks Topamax makes her more tired. Having just as many headaches, still 3-4 days per week. Sumatriptan helpful, takes Excedrin for milder headaches. Would like to wean off some medication if possible.   UPDATE 04/24/13 (LL): Since last visit, trigeminal neuralgia is stable on CBZ, but when dose is wearing off she feels "vibration" sensation sometimes on right side of mouth. Migraines are worse, averaging 3-4 days per week. Sometimes wakes up with one in morning, sometimes comes on later in day. Most often bi-temporal or behind eyes. Sometimes in occipital region. At worst with nausea and vomiting. Positive for hyperosmia. Migraines last 4-6 hours. Sometimes relived with Excedrin. Imitrex will stop severe headache in usually an hour.   UPDATE 10/23/12: Since last visit, doing about the same. Baclofen  BID did not help that much more than carbamazepine  BID alone. Migraines worse for a while, but  better with starting HCTZ and reducing Excedrin use.   UPDATE 04/09/12: Since last visit, doing about the same. Has not tried baclofen yet. Taking carbamazepine 600 mg twice a day. Still having some mild twinges of numbness and tingling on the right upper lip. No severe painful attacks.   UPDATE 09/04/11: Doing better. Tolerating CBZ  BID. Uses baclofen prn. No side effects. Mild twinges of aches, but no severe pain.   UPDATE 05/01/11: Doing well. Better on CBZ. Mild cognitive side effects. Fell 4 weeks ago from [employer], broke left foot, but doesn't think it was medication related.   PRIOR HPI: 59 year old right-handed female with history of hypertension, migraine headache, here for evaluation of right facial pain. Patient has had intermittent episodes of electrical and lightening, shooting pain in her right jaw for over 10 years. Previous episodes lasted for only a few weeks at a time. She describes brief, electrical, radiating flashes of pain in her right jaw, up towards her right eye, and deeper in her neck and inside her ear. She is been evaluated by her dentist numerous times over the years without a specific dental pathology to explain these symptoms. She does have a history of dental procedures and crowns on her right side.  Since 02/04/2011, patient's symptoms have been more severe and persistent. Has tried hydrocodone, gabapentin, without relief. She denies any numbness, weakness, slurred speech, vision problems. She has a first cousin who was diagnosed with multiple sclerosis and is deceased now.    REVIEW OF SYSTEMS: Full 14 system review of systems performed and notable only  for headaches, cough, murmur  ALLERGIES: Allergies  Allergen Reactions  . Morphine And Related   . Penicillins     HOME MEDICATIONS: Outpatient Prescriptions Prior to Visit  Medication Sig Dispense Refill  . Biotin 1000 MCG tablet Take 1,000 mcg by mouth daily.     . calcium carbonate (OS-CAL) 600 MG TABS Take  600 mg by mouth 2 (two) times daily with a meal.    . carbamazepine (TEGRETOL XR) 200 MG 12 hr tablet Take 2 tablets (400 mg total) by mouth 2 (two) times daily. 2 am , and 2 pm 360 tablet 3  . hydrochlorothiazide (MICROZIDE) 12.5 MG capsule Take 12.5 mg by mouth daily.    . nadolol (CORGARD) 40 MG tablet Take 1 tablet by mouth daily.    . Omega-3 Fatty Acids (FISH OIL) 1200 MG CAPS Take by mouth.    . SUMAtriptan (IMITREX) 100 MG tablet as needed.    . Vitamin D, Ergocalciferol, (DRISDOL) 50000 UNITS CAPS capsule Take 1 capsule by mouth once a week.     No facility-administered medications prior to visit.    PHYSICAL EXAM Filed Vitals:   04/16/14 1256 04/16/14 1258  BP:  154/90  Pulse:  64  Height: 5\' 3"  (1.6 m)   Weight: 177 lb 9.6 oz (80.559 kg)    Body mass index is 31.47 kg/(m^2).  Wt Readings from Last 3 Encounters:  04/16/14 177 lb 9.6 oz (80.559 kg)  10/09/13 176 lb (79.833 kg)  07/10/13 173 lb (78.472 kg)    GENERAL EXAM: Patient is in no distress; well developed, nourished and groomed; neck is supple  CARDIOVASCULAR: Regular rate and rhythm, no murmurs, no carotid bruits  NEUROLOGIC: MENTAL STATUS: awake, alert, language fluent, comprehension intact, naming intact, fund of knowledge appropriate CRANIAL NERVE:  pupils equal and reactive to light, visual fields full to confrontation, extraocular muscles intact, no nystagmus, facial sensation and strength symmetric, hearing intact, palate elevates symmetrically, uvula midline, shoulder shrug symmetric, tongue midline. MOTOR: normal bulk and tone, full strength in the BUE, BLE SENSORY: normal and symmetric to light touch COORDINATION: finger-nose-finger normal REFLEXES: deep tendon reflexes present and symmetric GAIT/STATION: narrow based gait; able to walk tandem; romberg is negative   DIAGNOSTICS: - none   ASSESSMENT: 59 y.o. 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 Overall stable, but with suboptimal control.  PLAN: - continue CBZ 400mg  BID for trigeminal neuralgia - continue sumatriptan prn severe migraine - limit use of Excedrin for milder headaches to avoid rebound headache - try amitriptyline 25mg  qhs for migraine prevention  Meds ordered this encounter  Medications  . amitriptyline (ELAVIL) 25 MG tablet    Sig: Take 1 tablet (25 mg total) by mouth at bedtime.    Dispense:  30 tablet    Refill:  6   Return in about 6 months (around 10/16/2014).     Suanne MarkerVIKRAM R. PENUMALLI, MD 04/16/2014, 1:21 PM Certified in Neurology, Neurophysiology and Neuroimaging  [facility] [address], [address] [place], [place] [address] [phone]

## 2014-10-13 ENCOUNTER — Telehealth: Payer: Self-pay | Admitting: Diagnostic Neuroimaging

## 2014-10-13 MED ORDER — CARBAMAZEPINE ER 200 MG PO TB12: 400.0000 mg | ORAL_TABLET | Freq: Two times a day (BID) | ORAL | Status: DC

## 2014-10-13 NOTE — Telephone Encounter (Signed)
Rx has been sent.  Receipt confirmed by pharmacy.   

## 2014-10-13 NOTE — Telephone Encounter (Signed)
Patient called requesting refill on carbamazepine (TEGRETOL XR) 200 MG 12 hr tablet to [facility]. Patient states she will run out of this medication before she sees Dr. Marjory Lies on 10/22/14.

## 2014-10-22 ENCOUNTER — Encounter: Payer: Self-pay | Admitting: Diagnostic Neuroimaging

## 2014-10-22 ENCOUNTER — Ambulatory Visit (INDEPENDENT_AMBULATORY_CARE_PROVIDER_SITE_OTHER): Payer: Managed Care, Other (non HMO) | Admitting: Diagnostic Neuroimaging

## 2014-10-22 VITALS — BP 125/77 | HR 76 | Ht 63.0 in | Wt 173.0 lb

## 2014-10-22 DIAGNOSIS — G44221 Chronic tension-type headache, intractable: Secondary | ICD-10-CM

## 2014-10-22 DIAGNOSIS — G500 Trigeminal neuralgia: Secondary | ICD-10-CM

## 2014-10-22 MED ORDER — CARBAMAZEPINE ER 200 MG PO TB12: 400.0000 mg | ORAL_TABLET | Freq: Two times a day (BID) | ORAL | Status: DC

## 2014-10-22 MED ORDER — AMITRIPTYLINE HCL 25 MG PO TABS: 25.0000 mg | ORAL_TABLET | Freq: Every day | ORAL | Status: DC

## 2014-10-22 NOTE — Progress Notes (Signed)
PATIENT: Kathleen [name] DOB: October 06, 1955  REASON FOR VISIT: routine follow up for trigeminal neuralgia, Migraine HISTORY FROM: patient  Chief Complaint  Patient presents with  . Trigeminal neuralgia    rm 7  . Follow-up    6 month    HISTORY OF PRESENT ILLNESS:  UPDATE 10/22/14 (VRP): Since last visit, no TN pain. Still with 4 headaches per week. Amitriptyline is helping.   UPDATE 04/16/14 (VRP): Since last visit, continues to have 3-4 tension HA per week, 0-1 migraine per month. Right trigeminal neuralgia pain is resolved, stable on CBZ  BID. Some ongoing stress. No other triggers. Left knee and hip pain limits physical activity.  UPDATE 10/09/13 (LL): Since last visit, she weaned off Topamax.  She does not notice any change in the frequency of headaches. States she continues to have 4 out of 7 days with a tension type headache.  She usually takes Excedrin on these days. Reduced her CBZ to 400 mg twice daily. Having less frequent severe Migraines, maybe 1-2 per month, in which she takes Imitrex for relief.  Sometimes takes 2 doses to feel better.  UPDATE 07/10/13 (LL): Since last visit, no facial pain or vibration. She thinks Topamax makes her more tired. Having just as many headaches, still 3-4 days per week. Sumatriptan helpful, takes Excedrin for milder headaches. Would like to wean off some medication if possible.   UPDATE 04/24/13 (LL): Since last visit, trigeminal neuralgia is stable on CBZ, but when dose is wearing off she feels "vibration" sensation sometimes on right side of mouth. Migraines are worse, averaging 3-4 days per week. Sometimes wakes up with one in morning, sometimes comes on later in day. Most often bi-temporal or behind eyes. Sometimes in occipital region. At worst with nausea and vomiting. Positive for hyperosmia. Migraines last 4-6 hours. Sometimes relived with Excedrin. Imitrex will stop severe headache in usually an hour.   UPDATE 10/23/12: Since last visit,  doing about the same. Baclofen  BID did not help that much more than carbamazepine  BID alone. Migraines worse for a while, but better with starting HCTZ and reducing Excedrin use.   UPDATE 04/09/12: Since last visit, doing about the same. Has not tried baclofen yet. Taking carbamazepine 600 mg twice a day. Still having some mild twinges of numbness and tingling on the right upper lip. No severe painful attacks.   UPDATE 09/04/11: Doing better. Tolerating CBZ  BID. Uses baclofen prn. No side effects. Mild twinges of aches, but no severe pain.   UPDATE 05/01/11: Doing well. Better on CBZ. Mild cognitive side effects. Fell 4 weeks ago from [employer], broke left foot, but doesn't think it was medication related.   PRIOR HPI: 59 year old right-handed female with history of hypertension, migraine headache, here for evaluation of right facial pain. Patient has had intermittent episodes of electrical and lightening, shooting pain in her right jaw for over 10 years. Previous episodes lasted for only a few weeks at a time. She describes brief, electrical, radiating flashes of pain in her right jaw, up towards her right eye, and deeper in her neck and inside her ear. She is been evaluated by her dentist numerous times over the years without a specific dental pathology to explain these symptoms. She does have a history of dental procedures and crowns on her right side. Since 02/04/2011, patient's symptoms have been more severe and persistent. Has tried hydrocodone, gabapentin, without relief. She denies any numbness, weakness, slurred speech, vision problems. She has a  first cousin who was diagnosed with multiple sclerosis and is deceased now.    REVIEW OF SYSTEMS: Full 14 system review of systems performed and notable only for headaches, cough, murmur  ALLERGIES: Allergies  Allergen Reactions  . Morphine And Related Itching  . Penicillins Diarrhea    HOME MEDICATIONS: Outpatient Prescriptions Prior  to Visit  Medication Sig Dispense Refill  . amitriptyline (ELAVIL) 25 MG tablet Take 1 tablet (25 mg total) by mouth at bedtime. 30 tablet 6  . calcium carbonate (OS-CAL) 600 MG TABS Take 600 mg by mouth 2 (two) times daily with a meal.    . carbamazepine (TEGRETOL XR) 200 MG 12 hr tablet Take 2 tablets (400 mg total) by mouth 2 (two) times daily. 360 tablet 0  . hydrochlorothiazide (MICROZIDE) 12.5 MG capsule Take 12.5 mg by mouth daily.    . nadolol (CORGARD) 40 MG tablet Take 1 tablet by mouth daily.    . Omega-3 Fatty Acids (FISH OIL) 1200 MG CAPS Take by mouth.    . SUMAtriptan (IMITREX) 100 MG tablet as needed.    . Biotin 1000 MCG tablet Take 1,000 mcg by mouth daily.      No facility-administered medications prior to visit.    PHYSICAL EXAM Filed Vitals:   10/22/14 1418  BP: 125/77  Pulse: 76  Height:  (1.6 m)  Weight: 173 lb (78.472 kg)   Body mass index is 30.65 kg/(m^2).  Wt Readings from Last 3 Encounters:  10/22/14 173 lb (78.472 kg)  04/16/14 177 lb 9.6 oz (80.559 kg)  10/09/13 176 lb (79.833 kg)    GENERAL EXAM: Patient is in no distress; well developed, nourished and groomed; neck is supple  CARDIOVASCULAR: Regular rate and rhythm, no murmurs, no carotid bruits  NEUROLOGIC: MENTAL STATUS: awake, alert, language fluent, comprehension intact, naming intact, fund of knowledge appropriate CRANIAL NERVE:  pupils equal and reactive to light, visual fields full to confrontation, extraocular muscles intact, no nystagmus, facial sensation and strength symmetric, hearing intact, palate elevates symmetrically, uvula midline, shoulder shrug symmetric, tongue midline. MOTOR: normal bulk and tone, full strength in the BUE, BLE SENSORY: normal and symmetric to light touch COORDINATION: finger-nose-finger normal REFLEXES: deep tendon reflexes present and symmetric GAIT/STATION: narrow based gait; able to walk tandem; romberg is negative   DIAGNOSTICS: -  none   ASSESSMENT: 59 y.o. 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 Overall stable, but with suboptimal control.  Dx:  Trigeminal neuralgia  Chronic tension-type headache, intractable     PLAN: - reduce CBZ to  BID for trigeminal neuralgia, since symptoms are stable - continue sumatriptan prn severe migraine - limit use of Excedrin for milder headaches to avoid rebound headache - continue amitriptyline  qhs for migraine prevention; may increas to  at bedtime  Meds ordered this encounter  Medications  . carbamazepine (TEGRETOL XR) 200 MG 12 hr tablet    Sig: Take 2 tablets (400 mg total) by mouth 2 (two) times daily.    Dispense:  360 tablet    Refill:  4  . amitriptyline (ELAVIL) 25 MG tablet    Sig: Take 1-2 tablets (25-50 mg total) by mouth at bedtime.    Dispense:  60 tablet    Refill:  12   Return in about 6 months (around 04/22/2015).     Suanne Marker, MD 10/22/2014, 2:42 PM Certified in Neurology, Neurophysiology  and Neuroimaging  [facility] [address], [address] [place], [place] [address] [phone]

## 2014-10-22 NOTE — Patient Instructions (Signed)
Thank you for coming to see Kathleen [name] at [facility]. I hope we have been able to provide you high quality care today.  You may receive a patient satisfaction survey over the next few weeks. We would appreciate your feedback and comments so that we may continue to improve ourselves and the health of our patients.  - reduce carbamazepine to 248m twice a day for 1 month, then consider increasing amitriptyline to 540mat bedtime   ~~~~~~~~~~~~~~~~~~~~~~~~~~~~~~~~~~~~~~~~~~~~~~~~~~~~~~~~~~~~~~~~~  DR. PENUMALLI'S GUIDE TO HAPPY AND HEALTHY LIVING These are some of my general health and wellness recommendations. Some of them may apply to you better than others. Please use common sense as you try these suggestions and feel free to ask me any questions.   ACTIVITY/FITNESS Mental, social, emotional and physical stimulation are very important for brain and body health. Try learning a new activity (arts, music, language, sports, games).  Keep moving your body to the best of your abilities. You can do this at home, inside or outside, the park, community center, gym or anywhere you like. Consider a physical therapist or personal trainer to get started. Consider the app Sworkit. Fitness trackers such as smart-watches, smart-phones or Fitbits can help as well.   NUTRITION Eat more plants: colorful vegetables, nuts, seeds and berries.  Eat less sugar, salt, preservatives and processed foods.  Avoid toxins such as cigarettes and alcohol.  Drink water when you are thirsty. Warm water with a slice of lemon is an excellent morning drink to start the day.  Consider these websites for more information The Nutrition Source (hthttps://www.henry-hernandez.biz/[employer] Nutrition (wwWindowBlog.ch  RELAXATION Consider practicing mindfulness meditation or other relaxation techniques such as deep breathing, prayer, yoga, tai chi, massage. See website  mindful.org or the apps Headspace or Calm to help get started.   SLEEP Try to get at least 7-8+ hours sleep per day. Regular exercise and reduced caffeine will help you sleep better. Practice good sleep hygeine techniques. See website sleep.org for more information.   PLANNING Prepare estate planning, living will, healthcare POA documents. Sometimes this is best planned with the help of an attorney. Theconversationproject.org and agingwithdignity.org are excellent resources.

## 2015-01-25 ENCOUNTER — Other Ambulatory Visit: Payer: Self-pay | Admitting: Orthopedic Surgery

## 2015-01-25 DIAGNOSIS — M25562 Pain in left knee: Secondary | ICD-10-CM

## 2015-01-30 ENCOUNTER — Ambulatory Visit
Admission: RE | Admit: 2015-01-30 | Discharge: 2015-01-30 | Disposition: A | Discharge status: Home / Self Care | Payer: Managed Care, Other (non HMO) | Source: Ambulatory Visit | Attending: Orthopedic Surgery | Admitting: Orthopedic Surgery

## 2015-01-30 DIAGNOSIS — M25562 Pain in left knee: Secondary | ICD-10-CM

## 2015-05-06 ENCOUNTER — Ambulatory Visit (INDEPENDENT_AMBULATORY_CARE_PROVIDER_SITE_OTHER): Payer: Managed Care, Other (non HMO) | Admitting: Diagnostic Neuroimaging

## 2015-05-06 ENCOUNTER — Encounter: Payer: Self-pay | Admitting: Diagnostic Neuroimaging

## 2015-05-06 VITALS — BP 128/84 | HR 68 | Resp 14 | Wt 176.8 lb

## 2015-05-06 DIAGNOSIS — G43109 Migraine with aura, not intractable, without status migrainosus: Secondary | ICD-10-CM | POA: Diagnosis not present

## 2015-05-06 DIAGNOSIS — G500 Trigeminal neuralgia: Secondary | ICD-10-CM | POA: Diagnosis not present

## 2015-05-06 DIAGNOSIS — G44221 Chronic tension-type headache, intractable: Secondary | ICD-10-CM | POA: Diagnosis not present

## 2015-05-06 MED ORDER — AMITRIPTYLINE HCL 25 MG PO TABS: 25.0000 mg | ORAL_TABLET | Freq: Every day | ORAL | Status: DC

## 2015-05-06 MED ORDER — CARBAMAZEPINE ER 200 MG PO TB12: 400.0000 mg | ORAL_TABLET | Freq: Two times a day (BID) | ORAL | Status: DC

## 2015-05-06 MED ORDER — SUMATRIPTAN SUCCINATE 100 MG PO TABS: 100.0000 mg | ORAL_TABLET | Freq: Once | ORAL | Status: DC | PRN

## 2015-05-06 NOTE — Progress Notes (Signed)
PATIENT: Kathleen MunroeDebbie S [name] DOB: 01/04/1956  REASON FOR VISIT: routine follow up for trigeminal neuralgia, Migraine HISTORY FROM: patient  Chief Complaint  Patient presents with  . Trigeminal Neuralgia    Sts. she decreased Carbamazepine to 200mg  bid as rx'd.  She is still using Excedrin at least 4 times per week.  She tried increasing Amitriptyline to 50mg  daily, but didn't see any benefit, so she decreased back to 25mg  daily/fim  . Migraines    HISTORY OF PRESENT ILLNESS:  UPDATE 05/06/15 (VRP): Since last visit, overall stable. Some pulling right ear sensation. No severe right TN pain. HA are stable. Tolerating CBZ 200mg  BID + amitriptyline 25mg  qhs.   UPDATE 10/22/14 (VRP): Since last visit, no TN pain. Still with 4 headaches per week. Amitriptyline is helping.   UPDATE 04/16/14 (VRP): Since last visit, continues to have 3-4 tension HA per week, 0-1 migraine per month. Right trigeminal neuralgia pain is resolved, stable on CBZ 400mg  BID. Some ongoing stress. No other triggers. Left knee and hip pain limits physical activity.  UPDATE 10/09/13 (LL): Since last visit, she weaned off Topamax.  She does not notice any change in the frequency of headaches. States she continues to have 4 out of 7 days with a tension type headache.  She usually takes Excedrin on these days. Reduced her CBZ to 400 mg twice daily. Having less frequent severe Migraines, maybe 1-2 per month, in which she takes Imitrex for relief.  Sometimes takes 2 doses to feel better.  UPDATE 07/10/13 (LL): Since last visit, no facial pain or vibration. She thinks Topamax makes her more tired. Having just as many headaches, still 3-4 days per week. Sumatriptan helpful, takes Excedrin for milder headaches. Would like to wean off some medication if possible.   UPDATE 04/24/13 (LL): Since last visit, trigeminal neuralgia is stable on CBZ, but when dose is wearing off she feels "vibration" sensation sometimes on right side of mouth.  Migraines are worse, averaging 3-4 days per week. Sometimes wakes up with one in morning, sometimes comes on later in day. Most often bi-temporal or behind eyes. Sometimes in occipital region. At worst with nausea and vomiting. Positive for hyperosmia. Migraines last 4-6 hours. Sometimes relived with Excedrin. Imitrex will stop severe headache in usually an hour.   UPDATE 10/23/12: Since last visit, doing about the same. Baclofen 10mg  BID did not help that much more than carbamazepine 600mg  BID alone. Migraines worse for a while, but better with starting HCTZ and reducing Excedrin use.   UPDATE 04/09/12: Since last visit, doing about the same. Has not tried baclofen yet. Taking carbamazepine 600 mg twice a day. Still having some mild twinges of numbness and tingling on the right upper lip. No severe painful attacks.   UPDATE 09/04/11: Doing better. Tolerating CBZ 600mg  BID. Uses baclofen prn. No side effects. Mild twinges of aches, but no severe pain.   UPDATE 05/01/11: Doing well. Better on CBZ. Mild cognitive side effects. Fell 4 weeks ago from [name], broke left foot, but doesn't think it was medication related.   PRIOR HPI: 60 year old right-handed female with history of hypertension, migraine headache, here for evaluation of right facial pain. Patient has had intermittent episodes of electrical and lightening, shooting pain in her right jaw for over 10 years. Previous episodes lasted for only a few weeks at a time. She describes brief, electrical, radiating flashes of pain in her right jaw, up towards her right eye, and deeper in her neck and inside  her ear. She is been evaluated by her dentist numerous times over the years without a specific dental pathology to explain these symptoms. She does have a history of dental procedures and crowns on her right side. Since 02/04/2011, patient's symptoms have been more severe and persistent. Has tried hydrocodone, gabapentin, without relief. She denies any numbness,  weakness, slurred speech, vision problems. She has a first cousin who was diagnosed with multiple sclerosis and is deceased now.    REVIEW OF SYSTEMS: Full 14 system review of systems performed and negative except: headaches, ringing in ears joint pain murmur.  ALLERGIES: Allergies  Allergen Reactions  . Morphine And Related Itching  . Penicillins Diarrhea    HOME MEDICATIONS: Outpatient Prescriptions Prior to Visit  Medication Sig Dispense Refill  . amitriptyline (ELAVIL) 25 MG tablet Take 1-2 tablets (25-50 mg total) by mouth at bedtime. 60 tablet 12  . calcium carbonate (OS-CAL) 600 MG TABS Take 600 mg by mouth 2 (two) times daily with a meal.    . carbamazepine (TEGRETOL XR) 200 MG 12 hr tablet Take 2 tablets (400 mg total) by mouth 2 (two) times daily. 360 tablet 4  . famotidine (PEPCID) 20 MG tablet Take 20 mg by mouth daily.    . hydrochlorothiazide (MICROZIDE) 12.5 MG capsule Take 12.5 mg by mouth daily.    . nadolol (CORGARD) 40 MG tablet Take 1 tablet by mouth daily.    . Omega-3 Fatty Acids (FISH OIL) 1200 MG CAPS Take by mouth.    . SUMAtriptan (IMITREX) 100 MG tablet as needed.     No facility-administered medications prior to visit.    PHYSICAL EXAM Filed Vitals:   05/06/15 1431  BP: 128/84  Pulse: 68  Resp: 14  Weight: 176 lb 12.8 oz (80.196 kg)   Body mass index is 31.33 kg/(m^2).  Wt Readings from Last 3 Encounters:  05/06/15 176 lb 12.8 oz (80.196 kg)  10/22/14 173 lb (78.472 kg)  04/16/14 177 lb 9.6 oz (80.559 kg)    GENERAL EXAM: Patient is in no distress; well developed, nourished and groomed; neck is supple  CARDIOVASCULAR: Regular rate and rhythm, no murmurs, no carotid bruits  NEUROLOGIC: MENTAL STATUS: awake, alert, language fluent, comprehension intact, naming intact, fund of knowledge appropriate CRANIAL NERVE:  pupils equal and reactive to light, visual fields full to confrontation, extraocular muscles intact, no nystagmus, facial  sensation and strength symmetric, hearing intact, palate elevates symmetrically, uvula midline, shoulder shrug symmetric, tongue midline. MOTOR: normal bulk and tone, full strength in the BUE, BLE SENSORY: normal and symmetric to light touch COORDINATION: finger-nose-finger normal REFLEXES: deep tendon reflexes present and symmetric GAIT/STATION: narrow based gait; SLIGHT ANTALGIC GAIT   DIAGNOSTICS: - none    ASSESSMENT: 60 y.o. 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 Overall stable, but with suboptimal control.  Dx:  Trigeminal neuralgia  Chronic tension-type headache, intractable  Migraine with aura and without status migrainosus, not intractable     PLAN: - continue CBZ  BID for trigeminal neuralgia; may increase to  twice a day if needed - continue amitriptyline  qhs for migraine prevention - continue sumatriptan prn severe migraine  Meds ordered this encounter  Medications  . amitriptyline (ELAVIL) 25 MG tablet    Sig: Take 1 tablet (25 mg total) by mouth at bedtime.    Dispense:  60 tablet    Refill:  12  .  carbamazepine (TEGRETOL XR) 200 MG 12 hr tablet    Sig: Take 2 tablets (400 mg total) by mouth 2 (two) times daily.    Dispense:  360 tablet    Refill:  4  . SUMAtriptan (IMITREX) 100 MG tablet    Sig: Take 1 tablet (100 mg total) by mouth once as needed for migraine. May repeat x 1 after 2 hours; maximum 2 tabs per day and 8 tabs per month    Dispense:  8 tablet    Refill:  6   Return in about 6 months (around 11/05/2015).     Suanne Marker, MD 05/06/2015, 2:59 PM Certified in Neurology, Neurophysiology and Neuroimaging  [facility] [address] [place], [place] [address] [phone]

## 2015-05-06 NOTE — Patient Instructions (Signed)

## 2015-05-13 ENCOUNTER — Other Ambulatory Visit: Payer: Self-pay

## 2015-05-13 DIAGNOSIS — Z1231 Encounter for screening mammogram for malignant neoplasm of breast: Secondary | ICD-10-CM

## 2015-05-26 ENCOUNTER — Ambulatory Visit
Admission: RE | Admit: 2015-05-26 | Discharge: 2015-05-26 | Disposition: A | Discharge status: Home / Self Care | Payer: Managed Care, Other (non HMO) | Source: Ambulatory Visit

## 2015-05-26 DIAGNOSIS — Z1231 Encounter for screening mammogram for malignant neoplasm of breast: Secondary | ICD-10-CM

## 2015-11-03 ENCOUNTER — Ambulatory Visit (INDEPENDENT_AMBULATORY_CARE_PROVIDER_SITE_OTHER): Payer: Managed Care, Other (non HMO) | Admitting: Diagnostic Neuroimaging

## 2015-11-03 ENCOUNTER — Encounter: Payer: Self-pay | Admitting: Diagnostic Neuroimaging

## 2015-11-03 VITALS — BP 132/81 | HR 67 | Wt 177.4 lb

## 2015-11-03 DIAGNOSIS — G44221 Chronic tension-type headache, intractable: Secondary | ICD-10-CM

## 2015-11-03 DIAGNOSIS — R200 Anesthesia of skin: Secondary | ICD-10-CM | POA: Diagnosis not present

## 2015-11-03 DIAGNOSIS — G43109 Migraine with aura, not intractable, without status migrainosus: Secondary | ICD-10-CM

## 2015-11-03 DIAGNOSIS — G500 Trigeminal neuralgia: Secondary | ICD-10-CM | POA: Diagnosis not present

## 2015-11-03 MED ORDER — CARBAMAZEPINE ER 200 MG PO TB12: 400.0000 mg | ORAL_TABLET | Freq: Two times a day (BID) | ORAL | 4 refills | Status: DC

## 2015-11-03 MED ORDER — SUMATRIPTAN SUCCINATE 100 MG PO TABS: 100.0000 mg | ORAL_TABLET | Freq: Once | ORAL | 12 refills | Status: DC | PRN

## 2015-11-03 MED ORDER — AMITRIPTYLINE HCL 25 MG PO TABS: 25.0000 mg | ORAL_TABLET | Freq: Every day | ORAL | 4 refills | Status: DC

## 2015-11-03 NOTE — Progress Notes (Signed)
PATIENT: Kathleen MunroeDebbie S [name] DOB: 02/19/1955  REASON FOR VISIT: routine follow up for trigeminal neuralgia, Migraine HISTORY FROM: patient  Chief Complaint  Patient presents with  . Trigeminal neuralgia    rm 7, "occas numbness on right side of mouth; HA have not gotten any better, take Excedrin for Tension HA almost every day"  . Follow-up    6 month  . Headache    HISTORY OF PRESENT ILLNESS:  UPDATE 11/03/15 (VRP): Since last visit sxs increased 3 weeks ago. Pt incr CBZ and sxs improved. Also with intermittent right lower facial numbness in last few months.   UPDATE 05/06/15 (VRP): Since last visit, overall stable. Some pulling right ear sensation. No severe right TN pain. HA are stable. Tolerating CBZ 200mg  BID + amitriptyline 25mg  qhs.   UPDATE 10/22/14 (VRP): Since last visit, no TN pain. Still with 4 headaches per week. Amitriptyline is helping.   UPDATE 04/16/14 (VRP): Since last visit, continues to have 3-4 tension HA per week, 0-1 migraine per month. Right trigeminal neuralgia pain is resolved, stable on CBZ 400mg  BID. Some ongoing stress. No other triggers. Left knee and hip pain limits physical activity.  UPDATE 10/09/13 (LL): Since last visit, she weaned off Topamax.  She does not notice any change in the frequency of headaches. States she continues to have 4 out of 7 days with a tension type headache.  She usually takes Excedrin on these days. Reduced her CBZ to 400 mg twice daily. Having less frequent severe Migraines, maybe 1-2 per month, in which she takes Imitrex for relief.  Sometimes takes 2 doses to feel better.  UPDATE 07/10/13 (LL): Since last visit, no facial pain or vibration. She thinks Topamax makes her more tired. Having just as many headaches, still 3-4 days per week. Sumatriptan helpful, takes Excedrin for milder headaches. Would like to wean off some medication if possible.   UPDATE 04/24/13 (LL): Since last visit, trigeminal neuralgia is stable on CBZ, but when  dose is wearing off she feels "vibration" sensation sometimes on right side of mouth. Migraines are worse, averaging 3-4 days per week. Sometimes wakes up with one in morning, sometimes comes on later in day. Most often bi-temporal or behind eyes. Sometimes in occipital region. At worst with nausea and vomiting. Positive for hyperosmia. Migraines last 4-6 hours. Sometimes relived with Excedrin. Imitrex will stop severe headache in usually an hour.   UPDATE 10/23/12: Since last visit, doing about the same. Baclofen 10mg  BID did not help that much more than carbamazepine 600mg  BID alone. Migraines worse for a while, but better with starting HCTZ and reducing Excedrin use.   UPDATE 04/09/12: Since last visit, doing about the same. Has not tried baclofen yet. Taking carbamazepine 600 mg twice a day. Still having some mild twinges of numbness and tingling on the right upper lip. No severe painful attacks.   UPDATE 09/04/11: Doing better. Tolerating CBZ 600mg  BID. Uses baclofen prn. No side effects. Mild twinges of aches, but no severe pain.   UPDATE 05/01/11: Doing well. Better on CBZ. Mild cognitive side effects. F[facility] weeks ago [facility], broke left foot, but doesn't think it was medication related.   PRIOR HPI: 60 year old right-handed female with history of hypertension, migraine headache, here for evaluation of right facial pain. Patient has had intermittent episodes of electrical and lightening, shooting pain in her right jaw for over 10 years. Previous episodes lasted for only a few weeks at a time. She describes brief, electrical,  radiating flashes of pain in her right jaw, up towards her right eye, and deeper in her neck and inside her ear. She is been evaluated by her dentist numerous times over the years without a specific dental pathology to explain these symptoms. She does have a history of dental procedures and crowns on her right side. Since 02/04/2011, patient's symptoms have been more severe and  persistent. Has tried hydrocodone, gabapentin, without relief. She denies any numbness, weakness, slurred speech, vision problems. She has a first cousin who was diagnosed with multiple sclerosis and is deceased now.    REVIEW OF SYSTEMS: Full 14 system review of systems performed and negative except: headaches, ringing in ears joint pain murmur.  ALLERGIES: Allergies  Allergen Reactions  . Morphine And Related Itching  . Penicillins Diarrhea    HOME MEDICATIONS: Outpatient Medications Prior to Visit  Medication Sig Dispense Refill  . amitriptyline (ELAVIL) 25 MG tablet Take 1 tablet (25 mg total) by mouth at bedtime. 60 tablet 12  . calcium carbonate (OS-CAL) 600 MG TABS Take 600 mg by mouth 2 (two) times daily with a meal.    . carbamazepine (TEGRETOL XR) 200 MG 12 hr tablet Take 2 tablets (400 mg total) by mouth 2 (two) times daily. 360 tablet 4  . famotidine (PEPCID) 20 MG tablet Take 20 mg by mouth daily.    . hydrochlorothiazide (MICROZIDE) 12.5 MG capsule Take 12.5 mg by mouth daily.    . nadolol (CORGARD) 40 MG tablet Take 1 tablet by mouth daily.    . Omega-3 Fatty Acids (FISH OIL) 1200 MG CAPS Take by mouth.    . SUMAtriptan (IMITREX) 100 MG tablet Take 1 tablet (100 mg total) by mouth once as needed for migraine. May repeat x 1 after 2 hours; maximum 2 tabs per day and 8 tabs per month 8 tablet 6   No facility-administered medications prior to visit.     PHYSICAL EXAM Vitals:   11/03/15 1307  BP: 132/81  Pulse: 67  Weight: 177 lb 6.4 oz (80.5 kg)   Body mass index is 31.42 kg/m.  Wt Readings from Last 3 Encounters:  11/03/15 177 lb 6.4 oz (80.5 kg)  05/06/15 176 lb 12.8 oz (80.2 kg)  10/22/14 173 lb (78.5 kg)    GENERAL EXAM: Patient is in no distress; well developed, nourished and groomed; neck is supple  CARDIOVASCULAR: Regular rate and rhythm, no murmurs, no carotid bruits  NEUROLOGIC: MENTAL STATUS: awake, alert, language fluent, comprehension intact,  naming intact, fund of knowledge appropriate CRANIAL NERVE:  pupils equal and reactive to light, visual fields full to confrontation, extraocular muscles intact, no nystagmus, facial sensation and strength symmetric, hearing intact, palate elevates symmetrically, uvula midline, shoulder shrug symmetric, tongue midline. MOTOR: normal bulk and tone, full strength in the BUE, BLE SENSORY: normal and symmetric to light touch COORDINATION: finger-nose-finger normal REFLEXES: deep tendon reflexes present and symmetric GAIT/STATION: narrow based gait; SLIGHT ANTALGIC GAIT   DIAGNOSTICS: - none    ASSESSMENT: 60 y.o. 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 Overall stable, but with suboptimal control.  Now with new right lower facial numbness since summer 2017.    Dx:  Trigeminal neuralgia  Chronic tension-type headache, intractable  Migraine with aura and without status migrainosus, not intractable  Right facial numbness     PLAN: - continue CBZ 200mg  BID for trigeminal neuralgia; may increase  to 400mg  twice a day if needed - continue amitriptyline 25mg  qhs for migraine prevention - continue sumatriptan prn severe migraine - check MRI brain to eval new right lower facial numbness  Meds ordered this encounter  Medications  . amitriptyline (ELAVIL) 25 MG tablet    Sig: Take 1 tablet (25 mg total) by mouth at bedtime.    Dispense:  90 tablet    Refill:  4  . carbamazepine (TEGRETOL XR) 200 MG 12 hr tablet    Sig: Take 2 tablets (400 mg total) by mouth 2 (two) times daily.    Dispense:  360 tablet    Refill:  4  . SUMAtriptan (IMITREX) 100 MG tablet    Sig: Take 1 tablet (100 mg total) by mouth once as needed for migraine. May repeat x 1 after 2 hours; maximum 2 tabs per day and 8 tabs per month    Dispense:  8 tablet    Refill:  12    Orders Placed This Encounter  Procedures  . MR FACE/TRIGEMINAL WO/W CM   Return in about 6 months (around 05/03/2016).     Suanne Marker, MD 11/03/2015, 1:26 PM Certified in Neurology, Neurophysiology and Neuroimaging  [facility] [address], [address] [place], [place] [address] [phone]

## 2015-11-03 NOTE — Patient Instructions (Signed)
-   continue current medications - I will check MRI brain 

## 2015-11-16 ENCOUNTER — Other Ambulatory Visit: Payer: Self-pay | Admitting: Diagnostic Neuroimaging

## 2015-11-17 ENCOUNTER — Telehealth: Payer: Self-pay | Admitting: *Deleted

## 2015-11-17 NOTE — Telephone Encounter (Signed)
[facility] asking for clarification on patient's MRI tomorrow. MRI brain or MRI face/trigeminal ? Per Dr Marjory LiesPenumalli, informed her it is MRI face/trigeminal. She verbalized understanding.

## 2015-11-18 ENCOUNTER — Ambulatory Visit
Admission: RE | Admit: 2015-11-18 | Discharge: 2015-11-18 | Disposition: A | Discharge status: Home / Self Care | Payer: Managed Care, Other (non HMO) | Source: Ambulatory Visit | Attending: Diagnostic Neuroimaging | Admitting: Diagnostic Neuroimaging

## 2015-11-18 DIAGNOSIS — R200 Anesthesia of skin: Secondary | ICD-10-CM

## 2015-11-18 DIAGNOSIS — G500 Trigeminal neuralgia: Secondary | ICD-10-CM | POA: Diagnosis not present

## 2015-11-18 MED ORDER — GADOBENATE DIMEGLUMINE 529 MG/ML IV SOLN
15.0000 mL | Freq: Once | INTRAVENOUS | Status: AC | PRN
  Administered 2015-11-18: 15 mL via INTRAVENOUS

## 2015-11-22 ENCOUNTER — Telehealth: Payer: Self-pay | Admitting: Diagnostic Neuroimaging

## 2015-11-22 NOTE — Telephone Encounter (Signed)
Received call back from patient. Per Dr Marjory LiesPenumalli, informed patient that her MRI results showed no stroke or major findings. There is a small normal blood vessel loop noted near the nerve, but likely does not need any other treatment.  Dr Marjory LiesPenumalli will continue with her current plan. Reviewed his instructions, plan per last office visit note with patient. Reminded her of 6 month FU; advised she call sooner for any questions, concerns. She verbalized understanding, appreciation.

## 2015-11-22 NOTE — Telephone Encounter (Signed)
Pt called for MRI results. May call [phone]

## 2015-11-22 NOTE — Telephone Encounter (Signed)
LVM requesting call back for MRI results. Left name, number. 

## 2016-01-13 ENCOUNTER — Telehealth: Payer: Self-pay | Admitting: *Deleted

## 2016-01-13 NOTE — Telephone Encounter (Signed)
LVM requesting patient call back to reschedule her April FU due to provider being out of the office. Advised her that the  phone staff may reschedule for her. Left number.

## 2016-05-03 ENCOUNTER — Ambulatory Visit: Payer: Managed Care, Other (non HMO) | Admitting: Diagnostic Neuroimaging

## 2016-05-08 ENCOUNTER — Ambulatory Visit (INDEPENDENT_AMBULATORY_CARE_PROVIDER_SITE_OTHER): Payer: Managed Care, Other (non HMO) | Admitting: Diagnostic Neuroimaging

## 2016-05-08 ENCOUNTER — Encounter: Payer: Self-pay | Admitting: Diagnostic Neuroimaging

## 2016-05-08 ENCOUNTER — Encounter (INDEPENDENT_AMBULATORY_CARE_PROVIDER_SITE_OTHER): Payer: Self-pay

## 2016-05-08 VITALS — BP 147/89 | HR 60 | Wt 177.2 lb

## 2016-05-08 DIAGNOSIS — G43109 Migraine with aura, not intractable, without status migrainosus: Secondary | ICD-10-CM | POA: Diagnosis not present

## 2016-05-08 DIAGNOSIS — G500 Trigeminal neuralgia: Secondary | ICD-10-CM | POA: Diagnosis not present

## 2016-05-08 DIAGNOSIS — G44221 Chronic tension-type headache, intractable: Secondary | ICD-10-CM

## 2016-05-08 MED ORDER — CARBAMAZEPINE ER 200 MG PO TB12: 400.0000 mg | ORAL_TABLET | Freq: Two times a day (BID) | ORAL | 4 refills | Status: DC

## 2016-05-08 MED ORDER — SUMATRIPTAN SUCCINATE 100 MG PO TABS: 100.0000 mg | ORAL_TABLET | Freq: Once | ORAL | 12 refills | Status: DC | PRN

## 2016-05-08 MED ORDER — AMITRIPTYLINE HCL 25 MG PO TABS: 25.0000 mg | ORAL_TABLET | Freq: Every day | ORAL | 4 refills | Status: DC

## 2016-05-08 NOTE — Patient Instructions (Signed)
 -   continue CBZ  BID for trigeminal neuralgia; may reduce to  twice a day if needed  - continue amitriptyline  at bedtime for migraine prevention  - continue sumatriptan as needed severe migraine

## 2016-05-08 NOTE — Progress Notes (Signed)
PATIENT: Kathleen [name] DOB: [birth year]  REASON FOR VISIT: routine follow up for trigeminal neuralgia, Migraine HISTORY FROM: patient  Chief Complaint  Patient presents with  . Trigeminal neuralgia    rm 6, " some recyuring episodes of trigemoinal neuralgia since MRI; increased Carbamazepine back up to 2 tabs 2 x day; under control now"  . Follow-up    6 month    HISTORY OF PRESENT ILLNESS:  UPDATE 05/08/16 (VRP): Since last visit, doing well. MRI brain was unremarkable. Right lower facial numbness improved. Now on CBZ  BID. Had 2 flare ups of pain (each lasting 1 week), and now better. Still with intermittent HA. Using OTC meds and rx meds. Still with some stress issues.   UPDATE 11/03/15 (VRP): Since last visit sxs increased 3 weeks ago. Pt incr CBZ and sxs improved. Also with intermittent right lower facial numbness in last few months.   UPDATE 05/06/15 (VRP): Since last visit, overall stable. Some pulling right ear sensation. No severe right TN pain. HA are stable. Tolerating CBZ  BID + amitriptyline  qhs.   UPDATE 10/22/14 (VRP): Since last visit, no TN pain. Still with 4 headaches per week. Amitriptyline is helping.   UPDATE 04/16/14 (VRP): Since last visit, continues to have 3-4 tension HA per week, 0-1 migraine per month. Right trigeminal neuralgia pain is resolved, stable on CBZ  BID. Some ongoing stress. No other triggers. Left knee and hip pain limits physical activity.  UPDATE 10/09/13 (LL): Since last visit, she weaned off Topamax.  She does not notice any change in the frequency of headaches. States she continues to have 4 out of 7 days with a tension type headache.  She usually takes Excedrin on these days. Reduced her CBZ to 400 mg twice daily. Having less frequent severe Migraines, maybe 1-2 per month, in which she takes Imitrex for relief.  Sometimes takes 2 doses to feel better.  UPDATE 07/10/13 (LL): Since last visit, no facial pain or vibration. She  thinks Topamax makes her more tired. Having just as many headaches, still 3-4 days per week. Sumatriptan helpful, takes Excedrin for milder headaches. Would like to wean off some medication if possible.   UPDATE 04/24/13 (LL): Since last visit, trigeminal neuralgia is stable on CBZ, but when dose is wearing off she feels "vibration" sensation sometimes on right side of mouth. Migraines are worse, averaging 3-4 days per week. Sometimes wakes up with one in morning, sometimes comes on later in day. Most often bi-temporal or behind eyes. Sometimes in occipital region. At worst with nausea and vomiting. Positive for hyperosmia. Migraines last 4-6 hours. Sometimes relived with Excedrin. Imitrex will stop severe headache in usually an hour.   UPDATE 10/23/12: Since last visit, doing about the same. Baclofen  BID did not help that much more than carbamazepine  BID alone. Migraines worse for a while, but better with starting HCTZ and reducing Excedrin use.   UPDATE 04/09/12: Since last visit, doing about the same. Has not tried baclofen yet. Taking carbamazepine 600 mg twice a day. Still having some mild twinges of numbness and tingling on the right upper lip. No severe painful attacks.   UPDATE 09/04/11: Doing better. Tolerating CBZ  BID. Uses baclofen prn. No side effects. Mild twinges of aches, but no severe pain.   UPDATE 05/01/11: Doing well. Better on CBZ. Mild cognitive side effects. Fell 4 weeks ago from [employer], broke left foot, but doesn't think it was medication related.   PRIOR HPI:  61 year old right-handed female with history of hypertension, migraine headache, here for evaluation of right facial pain. Patient has had intermittent episodes of electrical and lightening, shooting pain in her right jaw for over 10 years. Previous episodes lasted for only a few weeks at a time. She describes brief, electrical, radiating flashes of pain in her right jaw, up towards her right eye, and deeper in  her neck and inside her ear. She is been evaluated by her dentist numerous times over the years without a specific dental pathology to explain these symptoms. She does have a history of dental procedures and crowns on her right side. Since 02/04/2011, patient's symptoms have been more severe and persistent. Has tried hydrocodone, gabapentin, without relief. She denies any numbness, weakness, slurred speech, vision problems. She has a first cousin who was diagnosed with multiple sclerosis and is deceased now.    REVIEW OF SYSTEMS: Full 14 system review of systems performed and negative except: joint pain murmur ringing in ears.   ALLERGIES: Allergies  Allergen Reactions  . Morphine And Related Itching  . Penicillins Diarrhea    HOME MEDICATIONS: Outpatient Medications Prior to Visit  Medication Sig Dispense Refill  . Acetaminophen-Caffeine (EXCEDRIN TENSION HEADACHE PO) Take by mouth.    Marland Kitchen amitriptyline (ELAVIL) 25 MG tablet Take 1 tablet (25 mg total) by mouth at bedtime. 90 tablet 4  . calcium carbonate (OS-CAL) 600 MG TABS Take 600 mg by mouth 2 (two) times daily with a meal.    . carbamazepine (TEGRETOL XR) 200 MG 12 hr tablet Take 2 tablets (400 mg total) by mouth 2 (two) times daily. 360 tablet 4  . cholecalciferol (VITAMIN D) 400 units TABS tablet Take 400 Units by mouth.    . famotidine (PEPCID) 20 MG tablet Take 20 mg by mouth daily.    . hydrochlorothiazide (MICROZIDE) 12.5 MG capsule Take 12.5 mg by mouth daily.    . nadolol (CORGARD) 40 MG tablet Take 1 tablet by mouth daily.    . Omega-3 Fatty Acids (FISH OIL) 1200 MG CAPS Take by mouth.    . SUMAtriptan (IMITREX) 100 MG tablet Take 1 tablet (100 mg total) by mouth once as needed for migraine. May repeat x 1 after 2 hours; maximum 2 tabs per day and 8 tabs per month 8 tablet 12   No facility-administered medications prior to visit.     PHYSICAL EXAM Vitals:   05/08/16 1121  BP: (!) 147/89  Pulse: 60  Weight: 177 lb 3.2 oz  (80.4 kg)   Body mass index is 31.39 kg/m.  Wt Readings from Last 3 Encounters:  05/08/16 177 lb 3.2 oz (80.4 kg)  11/03/15 177 lb 6.4 oz (80.5 kg)  05/06/15 176 lb 12.8 oz (80.2 kg)    GENERAL EXAM: Patient is in no distress; well developed, nourished and groomed; neck is supple  CARDIOVASCULAR: Regular rate and rhythm, no murmurs, no carotid bruits  NEUROLOGIC: MENTAL STATUS: awake, alert, language fluent, comprehension intact, naming intact, fund of knowledge appropriate CRANIAL NERVE:  pupils equal and reactive to light, visual fields full to confrontation, extraocular muscles intact, no nystagmus, facial sensation and strength symmetric, hearing intact, palate elevates symmetrically, uvula midline, shoulder shrug symmetric, tongue midline. MOTOR: normal bulk and tone, full strength in the BUE, BLE SENSORY: normal and symmetric to light touch COORDINATION: finger-nose-finger normal REFLEXES: deep tendon reflexes present and symmetric GAIT/STATION: narrow based gait; SLIGHT ANTALGIC GAIT   DIAGNOSTICS:  No flowsheet data found.  No flowsheet data found.  11/18/15 MRI of the face with and without contrast shows the following: 1.   Although there is no definite distortion of the right trigeminal nerve, the right anterior inferior cerebellar artery adjacent to the right trigeminal dorsal root entry zone.    2.   The visible brain and other structures of the head appear normal. 3.    There are no acute findings.    ASSESSMENT: 61 y.o. 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 Overall stable, but with suboptimal control.  Now with new right lower facial numbness since summer 2017. MRI brain and CN5 unremarkable.    Dx:  Trigeminal neuralgia  Chronic tension-type headache, intractable  Migraine with aura and without status  migrainosus, not intractable     PLAN:  - continue CBZ  BID for trigeminal neuralgia; may reduce to  twice a day if needed  - continue amitriptyline  at bedtime for migraine prevention  - continue sumatriptan as needed severe migraine   Meds ordered this encounter  Medications  . SUMAtriptan (IMITREX) 100 MG tablet    Sig: Take 1 tablet (100 mg total) by mouth once as needed for migraine. May repeat x 1 after 2 hours; maximum 2 tabs per day and 8 tabs per month    Dispense:  8 tablet    Refill:  12  . carbamazepine (TEGRETOL XR) 200 MG 12 hr tablet    Sig: Take 2 tablets (400 mg total) by mouth 2 (two) times daily.    Dispense:  360 tablet    Refill:  4  . amitriptyline (ELAVIL) 25 MG tablet    Sig: Take 1 tablet (25 mg total) by mouth at bedtime.    Dispense:  90 tablet    Refill:  4   Return in about 1 year (around 05/08/2017).     Suanne Marker, MD 05/08/2016, 11:45 AM Certified in Neurology, Neurophysiology and Neuroimaging  [facility] [address], [address] [place], [place] [address] [phone]

## 2016-12-11 ENCOUNTER — Other Ambulatory Visit: Payer: Self-pay | Admitting: Family Medicine

## 2016-12-11 DIAGNOSIS — Z1231 Encounter for screening mammogram for malignant neoplasm of breast: Secondary | ICD-10-CM

## 2017-01-10 ENCOUNTER — Ambulatory Visit
Admission: RE | Admit: 2017-01-10 | Discharge: 2017-01-10 | Disposition: A | Discharge status: Home / Self Care | Payer: Managed Care, Other (non HMO) | Source: Ambulatory Visit | Attending: Family Medicine | Admitting: Family Medicine

## 2017-01-10 DIAGNOSIS — Z1231 Encounter for screening mammogram for malignant neoplasm of breast: Secondary | ICD-10-CM

## 2017-05-14 ENCOUNTER — Ambulatory Visit: Payer: Managed Care, Other (non HMO) | Admitting: Diagnostic Neuroimaging

## 2017-05-14 ENCOUNTER — Encounter: Payer: Self-pay | Admitting: Diagnostic Neuroimaging

## 2017-05-14 VITALS — BP 130/64 | HR 70 | Ht 63.0 in | Wt 166.0 lb

## 2017-05-14 DIAGNOSIS — G44221 Chronic tension-type headache, intractable: Secondary | ICD-10-CM

## 2017-05-14 DIAGNOSIS — G500 Trigeminal neuralgia: Secondary | ICD-10-CM | POA: Diagnosis not present

## 2017-05-14 DIAGNOSIS — G43109 Migraine with aura, not intractable, without status migrainosus: Secondary | ICD-10-CM

## 2017-05-14 MED ORDER — CARBAMAZEPINE ER 200 MG PO TB12: 400.0000 mg | ORAL_TABLET | Freq: Two times a day (BID) | ORAL | 4 refills | Status: DC

## 2017-05-14 MED ORDER — AMITRIPTYLINE HCL 25 MG PO TABS: 25.0000 mg | ORAL_TABLET | Freq: Every day | ORAL | 4 refills | Status: DC

## 2017-05-14 MED ORDER — SUMATRIPTAN SUCCINATE 100 MG PO TABS: 100.0000 mg | ORAL_TABLET | Freq: Once | ORAL | 12 refills | Status: DC | PRN

## 2017-05-14 NOTE — Progress Notes (Signed)
PATIENT: Kathleen [name] DOB: August 23, 1955  REASON FOR VISIT: routine follow up for trigeminal neuralgia, Migraine HISTORY FROM: patient  Chief Complaint  Patient presents with  . Follow-up    trigeminal neuralgia & headaches. She states her trigeminal neuralgia is tolerable, she adjusts her Tegretol according to her symptoms, takes about 2-3 pills per day. She still has headaches, has had them forever she states.    . Room 7    She is here alone    HISTORY OF PRESENT ILLNESS:  UPDATE (05/14/17, VRP): Since last visit, doing well. Tolerating meds. No alleviating or aggravating factors. HA are stable. TN pain is mild, but still there, esp with brushing, touching or eating.  UPDATE 05/08/16 (VRP): Since last visit, doing well. MRI brain was unremarkable. Right lower facial numbness improved. Now on CBZ  twice a day. Had 2 flare ups of pain (each lasting 1 week), and now better. Still with intermittent HA. Using OTC meds and rx meds. Still with some stress issues.   UPDATE 11/03/15 (VRP): Since last visit sxs increased 3 weeks ago. Pt incr CBZ and sxs improved. Also with intermittent right lower facial numbness in last few months.   UPDATE 05/06/15 (VRP): Since last visit, overall stable. Some pulling right ear sensation. No severe right TN pain. HA are stable. Tolerating CBZ  BID + amitriptyline  qhs.   UPDATE 10/22/14 (VRP): Since last visit, no TN pain. Still with 4 headaches per week. Amitriptyline is helping.   UPDATE 04/16/14 (VRP): Since last visit, continues to have 3-4 tension HA per week, 0-1 migraine per month. Right trigeminal neuralgia pain is resolved, stable on CBZ  BID. Some ongoing stress. No other triggers. Left knee and hip pain limits physical activity.  UPDATE 10/09/13 (LL): Since last visit, she weaned off Topamax.  She does not notice any change in the frequency of headaches. States she continues to have 4 out of 7 days with a tension type headache.  She  usually takes Excedrin on these days. Reduced her CBZ to 400 mg twice daily. Having less frequent severe Migraines, maybe 1-2 per month, in which she takes Imitrex for relief.  Sometimes takes 2 doses to feel better.  UPDATE 07/10/13 (LL): Since last visit, no facial pain or vibration. She thinks Topamax makes her more tired. Having just as many headaches, still 3-4 days per week. Sumatriptan helpful, takes Excedrin for milder headaches. Would like to wean off some medication if possible.   UPDATE 04/24/13 (LL): Since last visit, trigeminal neuralgia is stable on CBZ, but when dose is wearing off she feels "vibration" sensation sometimes on right side of mouth. Migraines are worse, averaging 3-4 days per week. Sometimes wakes up with one in morning, sometimes comes on later in day. Most often bi-temporal or behind eyes. Sometimes in occipital region. At worst with nausea and vomiting. Positive for hyperosmia. Migraines last 4-6 hours. Sometimes relived with Excedrin. Imitrex will stop severe headache in usually an hour.   UPDATE 10/23/12: Since last visit, doing about the same. Baclofen  BID did not help that much more than carbamazepine  BID alone. Migraines worse for a while, but better with starting HCTZ and reducing Excedrin use.   UPDATE 04/09/12: Since last visit, doing about the same. Has not tried baclofen yet. Taking carbamazepine 600 mg twice a day. Still having some mild twinges of numbness and tingling on the right upper lip. No severe painful attacks.   UPDATE 09/04/11: Doing better. Tolerating CBZ   BID. Uses baclofen prn. No side effects. Mild twinges of aches, but no severe pain.   UPDATE 05/01/11: Doing well. Better on CBZ. Mild cognitive side effects. Fell 4 weeks ago from [employer], broke left foot, but doesn't think it was medication related.   PRIOR HPI: 62 year old right-handed female with history of hypertension, migraine headache, here for evaluation of right facial pain.  Patient has had intermittent episodes of electrical and lightening, shooting pain in her right jaw for over 10 years. Previous episodes lasted for only a few weeks at a time. She describes brief, electrical, radiating flashes of pain in her right jaw, up towards her right eye, and deeper in her neck and inside her ear. She is been evaluated by her dentist numerous times over the years without a specific dental pathology to explain these symptoms. She does have a history of dental procedures and crowns on her right side. Since 02/04/2011, patient's symptoms have been more severe and persistent. Has tried hydrocodone, gabapentin, without relief. She denies any numbness, weakness, slurred speech, vision problems. She has a first cousin who was diagnosed with multiple sclerosis and is deceased now.    REVIEW OF SYSTEMS: Full 14 system review of systems performed and negative except: joint pain constipation ringing in ears.    ALLERGIES: Allergies  Allergen Reactions  . Morphine And Related Itching  . Penicillins Diarrhea    HOME MEDICATIONS: Outpatient Medications Prior to Visit  Medication Sig Dispense Refill  . Acetaminophen-Caffeine (EXCEDRIN TENSION HEADACHE PO) Take by mouth.    Marland Kitchen amitriptyline (ELAVIL) 25 MG tablet Take 1 tablet (25 mg total) by mouth at bedtime. 90 tablet 4  . calcium carbonate (OS-CAL) 600 MG TABS Take 600 mg by mouth 2 (two) times daily with a meal.    . carbamazepine (TEGRETOL XR) 200 MG 12 hr tablet Take 2 tablets (400 mg total) by mouth 2 (two) times daily. 360 tablet 4  . famotidine (PEPCID) 20 MG tablet Take 20 mg by mouth daily.    . hydrochlorothiazide (MICROZIDE) 12.5 MG capsule Take 12.5 mg by mouth daily.    . nadolol (CORGARD) 40 MG tablet Take 1 tablet by mouth daily.    . Omega-3 Fatty Acids (FISH OIL) 1200 MG CAPS Take by mouth.    . SUMAtriptan (IMITREX) 100 MG tablet Take 1 tablet (100 mg total) by mouth once as needed for migraine. May repeat x 1 after 2  hours; maximum 2 tabs per day and 8 tabs per month 8 tablet 12  . Vitamin D, Ergocalciferol, (DRISDOL) 50000 units CAPS capsule Take 50,000 Units by mouth every 7 (seven) days.    . cholecalciferol (VITAMIN D) 400 units TABS tablet Take 400 Units by mouth.     No facility-administered medications prior to visit.     PHYSICAL EXAM Vitals:   05/14/17 1608  BP: 130/64  Pulse: 70  Weight: 166 lb (75.3 kg)  Height:  (1.6 m)   Body mass index is 29.41 kg/m.  Wt Readings from Last 3 Encounters:  05/14/17 166 lb (75.3 kg)  05/08/16 177 lb 3.2 oz (80.4 kg)  11/03/15 177 lb 6.4 oz (80.5 kg)    GENERAL EXAM: Patient is in no distress; well developed, nourished and groomed; neck is supple  CARDIOVASCULAR: Regular rate and rhythm, no murmurs, no carotid bruits  NEUROLOGIC: MENTAL STATUS: awake, alert, language fluent, comprehension intact, naming intact, fund of knowledge appropriate CRANIAL NERVE:  pupils equal and reactive to light, visual fields full  to confrontation, extraocular muscles intact, no nystagmus, facial sensation and strength symmetric, hearing intact, palate elevates symmetrically, uvula midline, shoulder shrug symmetric, tongue midline. MOTOR: normal bulk and tone, full strength in the BUE, BLE SENSORY: normal and symmetric to light touch COORDINATION: finger-nose-finger normal REFLEXES: deep tendon reflexes present and symmetric GAIT/STATION: narrow based gait; SLIGHT ANTALGIC GAIT   DIAGNOSTICS:  No flowsheet data found.  No flowsheet data found.   11/18/15 MRI of the face with and without contrast shows the following: 1.   Although there is no definite distortion of the right trigeminal nerve, the right anterior inferior cerebellar artery adjacent to the right trigeminal dorsal root entry zone.    2.   The visible brain and other structures of the head appear normal. 3.    There are no acute findings.    ASSESSMENT: 62 y.o. female has a past medical  history of Hypertension and Trigeminal neuralgia here with right trigeminal neuralgia (stable) and mixed tension-type and migraine headaches with aura. Overall stable, but with suboptimal control.  Now with new right lower facial numbness since summer 2017. MRI brain and CN5 unremarkable.    Dx:  Trigeminal neuralgia  Migraine with aura and without status migrainosus, not intractable  Chronic tension-type headache, intractable     PLAN:  TRIGEMINAL NEURALGIA - continue carbamazepine (up to  twice a day) for trigeminal neuralgia  MIGRAINE WITH AURA - continue amitriptyline  at bedtime for migraine prevention - continue sumatriptan as needed severe migraine  Meds ordered this encounter  Medications  . SUMAtriptan (IMITREX) 100 MG tablet    Sig: Take 1 tablet (100 mg total) by mouth once as needed for migraine. May repeat x 1 after 2 hours; maximum 2 tabs per day and 8 tabs per month    Dispense:  8 tablet    Refill:  12  . carbamazepine (TEGRETOL XR) 200 MG 12 hr tablet    Sig: Take 2 tablets (400 mg total) by mouth 2 (two) times daily.    Dispense:  360 tablet    Refill:  4  . amitriptyline (ELAVIL) 25 MG tablet    Sig: Take 1 tablet (25 mg total) by mouth at bedtime.    Dispense:  90 tablet    Refill:  4   Return in about 1 year (around 05/15/2018).     Suanne Marker, MD 05/14/2017, 4:16 PM Certified in Neurology, Neurophysiology and Neuroimaging  [facility] [address], [address] [place], [place] [address] [phone]

## 2017-07-15 ENCOUNTER — Encounter (HOSPITAL_BASED_OUTPATIENT_CLINIC_OR_DEPARTMENT_OTHER): Payer: Self-pay | Admitting: Emergency Medicine

## 2017-07-15 ENCOUNTER — Other Ambulatory Visit: Payer: Self-pay

## 2017-07-15 ENCOUNTER — Emergency Department (HOSPITAL_BASED_OUTPATIENT_CLINIC_OR_DEPARTMENT_OTHER)
Admission: EM | Admit: 2017-07-15 | Discharge: 2017-07-15 | Disposition: A | Discharge status: Home / Self Care | Payer: Commercial Managed Care - PPO | Attending: Emergency Medicine | Admitting: Emergency Medicine

## 2017-07-15 DIAGNOSIS — R300 Dysuria: Secondary | ICD-10-CM | POA: Diagnosis present

## 2017-07-15 DIAGNOSIS — I10 Essential (primary) hypertension: Secondary | ICD-10-CM | POA: Diagnosis not present

## 2017-07-15 DIAGNOSIS — N3001 Acute cystitis with hematuria: Secondary | ICD-10-CM | POA: Diagnosis not present

## 2017-07-15 DIAGNOSIS — Z79899 Other long term (current) drug therapy: Secondary | ICD-10-CM | POA: Insufficient documentation

## 2017-07-15 LAB — URINALYSIS, MICROSCOPIC (REFLEX)
RBC / HPF: >50 RBC/hpf (ref 0–5)
WBC, UA: >50 WBC/hpf (ref 0–5)

## 2017-07-15 LAB — URINALYSIS, ROUTINE W REFLEX MICROSCOPIC
Bilirubin Urine: NEGATIVE
Glucose, UA: NEGATIVE mg/dL
Ketones, ur: NEGATIVE mg/dL
Nitrite: NEGATIVE
Protein, ur: 100 mg/dL — AB
Specific Gravity, Urine: 1.015 (ref 1.005–1.030)
pH: 7 (ref 5.0–8.0)

## 2017-07-15 MED ORDER — PHENAZOPYRIDINE HCL 200 MG PO TABS: 200.0000 mg | ORAL_TABLET | Freq: Three times a day (TID) | ORAL | 0 refills | Status: DC

## 2017-07-15 MED ORDER — ACETAMINOPHEN 325 MG PO TABS
650.0000 mg | ORAL_TABLET | Freq: Once | ORAL | Status: DC
  Filled 2017-07-15: qty 2

## 2017-07-15 MED ORDER — SULFAMETHOXAZOLE-TRIMETHOPRIM 800-160 MG PO TABS: 1.0000 | ORAL_TABLET | Freq: Two times a day (BID) | ORAL | 0 refills | Status: DC

## 2017-07-15 MED ORDER — SULFAMETHOXAZOLE-TRIMETHOPRIM 800-160 MG PO TABS
1.0000 | ORAL_TABLET | Freq: Once | ORAL | Status: AC
  Administered 2017-07-15: 1 via ORAL
  Filled 2017-07-15: qty 1

## 2017-07-15 MED ORDER — PHENAZOPYRIDINE HCL 100 MG PO TABS
200.0000 mg | ORAL_TABLET | Freq: Once | ORAL | Status: AC
  Administered 2017-07-15: 200 mg via ORAL
  Filled 2017-07-15: qty 2

## 2017-07-15 NOTE — ED Provider Notes (Signed)
[facility] EMERGENCY DEPARTMENT Provider Note   CSN: [id number] Arrival date & time: 07/15/17  1853     History   Chief Complaint Chief Complaint  Patient presents with  . Dysuria    HPI Kathleen [name] is a 62 y.o. female history of hypertension who presents emergency department today for urinary symptoms.  Patient reports that today she started developing some suprapubic abdominal discomfort, that she describes as pressure and urgency and rates as a current 5/10.  She reports she has associated urinary urgency, frequency, hematuria as well as dysuria.  She states this is similar to her prior UTI.  Patient has not taken anything for her symptoms as she did not want to make her symptoms worse.  She knows nothing makes her symptoms better or worse.  She denies any associated fever, nausea/vomiting, flank pain, vaginal discharge or vaginal bleeding.  Patient denies history of kidney stones.  HPI  Past Medical History:  Diagnosis Date  . Hypertension   . Trigeminal neuralgia     Patient Active Problem List   Diagnosis Date Noted  . Migraine with aura 04/24/2013  . Trigeminal neuralgia 04/09/2012    Past Surgical History:  Procedure Laterality Date  . VAGINAL HYSTERECTOMY       OB History   None      Home Medications    Prior to Admission medications   Medication Sig Start Date End Date Taking? Authorizing Provider  Acetaminophen-Caffeine (EXCEDRIN TENSION HEADACHE PO) Take by mouth.    [provider]  amitriptyline (ELAVIL) 25 MG tablet Take 1 tablet (25 mg total) by mouth at bedtime. 05/14/17   Penumalli, Glenford Bayley, MD  calcium carbonate (OS-CAL) 600 MG TABS Take 600 mg by mouth 2 (two) times daily with a meal.    [provider]  carbamazepine (TEGRETOL XR) 200 MG 12 hr tablet Take 2 tablets (400 mg total) by mouth 2 (two) times daily. 05/14/17   Penumalli, Glenford Bayley, MD  cholecalciferol (VITAMIN D) 400 units TABS tablet Take 400 Units by  mouth.    [provider]  famotidine (PEPCID) 20 MG tablet Take 20 mg by mouth daily.    [provider]  hydrochlorothiazide (MICROZIDE) 12.5 MG capsule Take 12.5 mg by mouth daily.    [provider]  nadolol (CORGARD) 40 MG tablet Take 1 tablet by mouth daily. 04/04/12   [provider]  Omega-3 Fatty Acids (FISH OIL) 1200 MG CAPS Take by mouth.    [provider]  SUMAtriptan (IMITREX) 100 MG tablet Take 1 tablet (100 mg total) by mouth once as needed for migraine. May repeat x 1 after 2 hours; maximum 2 tabs per day and 8 tabs per month 05/14/17   Penumalli, Glenford Bayley, MD  Vitamin D, Ergocalciferol, (DRISDOL) 50000 units CAPS capsule Take 50,000 Units by mouth every 7 (seven) days.    [provider]    Family History Family History  Problem Relation Age of Onset  . Atrial fibrillation Mother   . Hypertension Mother   . Arthritis Mother   . Heart attack Father   . Bone cancer Other   . Liver disease Sister   . Bipolar disorder Sister     Social History Social History   Tobacco Use  . Smoking status: Never Smoker  . Smokeless tobacco: Never Used  Substance Use Topics  . Alcohol use: No  . Drug use: No     Allergies   Morphine and related and  Penicillins   Review of Systems Review of Systems  All other systems reviewed and are negative.    Physical Exam Updated Vital Signs BP (!) 149/79 (BP Location: Right Arm)   Pulse 83   Temp 98.4 F (36.9 C) (Oral)   Resp 16   Ht 5\' 3"  (1.6 m)   Wt 70.8 kg (156 lb)   SpO2 100%   BMI 27.63 kg/m   Physical Exam  Constitutional: She appears well-developed and well-nourished.  HENT:  Head: Normocephalic and atraumatic.  Right Ear: External ear normal.  Left Ear: External ear normal.  Nose: Nose normal.  Mouth/Throat: Uvula is midline, oropharynx is clear and moist and mucous membranes are normal. No tonsillar exudate.  Eyes: Pupils are equal, round, and reactive to  light. Right eye exhibits no discharge. Left eye exhibits no discharge. No scleral icterus.  Neck: Trachea normal. Neck supple. No spinous process tenderness present. No neck rigidity. Normal range of motion present.  Cardiovascular: Normal rate, regular rhythm and intact distal pulses.  No murmur heard. Pulses:      Radial pulses are 2+ on the right side, and 2+ on the left side.       Dorsalis pedis pulses are 2+ on the right side, and 2+ on the left side.       Posterior tibial pulses are 2+ on the right side, and 2+ on the left side.  No lower extremity swelling or edema. Calves symmetric in size bilaterally.  Pulmonary/Chest: Effort normal and breath sounds normal. She exhibits no tenderness.  Abdominal: Soft. Bowel sounds are normal. She exhibits no distension. There is no tenderness. There is no rigidity, no rebound, no guarding and no CVA tenderness.  Musculoskeletal: She exhibits no edema.  Lymphadenopathy:    She has no cervical adenopathy.  Neurological: She is alert.  Skin: Skin is warm and dry. No rash noted. She is not diaphoretic.  Psychiatric: She has a normal mood and affect.  Nursing note and vitals reviewed.    ED Treatments / Results  Labs (all labs ordered are listed, but only abnormal results are displayed) Labs Reviewed  URINALYSIS, ROUTINE W REFLEX MICROSCOPIC - Abnormal; Notable for the following components:      Result Value   Color, Urine AMBER (*)    APPearance CLOUDY (*)    Hgb urine dipstick LARGE (*)    Protein, ur 100 (*)    Leukocytes, UA SMALL (*)    All other components within normal limits  URINALYSIS, MICROSCOPIC (REFLEX) - Abnormal; Notable for the following components:   Bacteria, UA MANY (*)    All other components within normal limits  URINE CULTURE    EKG None  Radiology No results found.  Procedures Procedures (including critical care time)  Medications Ordered in ED Medications  sulfamethoxazole-trimethoprim (BACTRIM  DS,SEPTRA DS) 800-160 MG per tablet 1 tablet (has no administration in time range)     Initial Impression / Assessment and Plan / ED Course  I have reviewed the triage vital signs and the nursing notes.  Pertinent labs & imaging results that were available during my care of the patient were reviewed by me and considered in my medical decision making (see chart for details).     62 y.o. Pt diagnosed with a UTI. Pt is afebrile, no CVA tenderness, normotensive, and denies N/V. No history of kidney stones. She does have PCN allergy. First dose given in department. Urine culture sent. I advised the patient to follow-up with PCP  this week. Specific return precautions discussed. Time was given for all questions to be answered. The patient verbalized understanding and agreement with plan. The patient appears safe for discharge home.  Final Clinical Impressions(s) / ED Diagnoses   Final diagnoses:  Acute cystitis with hematuria    ED Discharge Orders        Ordered    phenazopyridine (PYRIDIUM) 200 MG tablet  3 times daily     07/15/17 2217    sulfamethoxazole-trimethoprim (BACTRIM DS,SEPTRA DS) 800-160 MG tablet  2 times daily     07/15/17 2217       Princella Pellegrini 07/15/17 2217    Arby Barrette, MD 07/17/17 1148

## 2017-07-15 NOTE — ED Notes (Signed)
Pt given Rx x 2 for septra and pyridium. D/c home with her family

## 2017-07-15 NOTE — ED Triage Notes (Signed)
Patient states that she had a sudden onset of burning with urination at around 3 pm today  - patient states that she has a pressure to her bladder

## 2017-07-15 NOTE — Discharge Instructions (Signed)
Please read and follow all provided instructions You have been seen today for your complaint of pain with urination. Your lab work showed urine infection.  Your discharge medications include: 1) Bactrim Please take all of your antibiotics until finished!   You may develop abdominal discomfort or diarrhea from the antibiotic.  You may help offset this with probiotics which you can buy or get in yogurt. Do not eat or take the probiotics until 2 hours after your antibiotic. Do not take your medicine if develop an itchy rash, swelling in your mouth or lips, or difficulty breathing.  2) Pyridium  This medication will help relieve pain and burning but does not treat the infection.  Make sure that you wear a panty liner as it may stain your underwear. Do not be alarmed if this turns your urine orange. To void upset stomach please take with food. Home care instructions are as follows:  1) Please drink plenty of water. Avoid tea and beverages with caffeine like coffee or soda 2) If you are sexually active, make sure to urinate immediately after intercourse.  Follow up:  Please follow up with your primary care physician in 1-2 days. If you do not have one please call the [facility] number listed above. Please seek immediate medical care if you develop any of the following symptoms: SEEK MEDICAL CARE IF:  You have back pain.  You develop a fever.  Your symptoms do not begin to resolve within 3 days.  SEEK IMMEDIATE MEDICAL CARE IF:  You have severe back pain or lower abdominal pain.  You develop chills.  You have nausea or vomiting.  You have continued burning or discomfort with urination even after completion of antibiotic. Additional Information:  Your vital signs today were: BP (!) 149/79 (BP Location: Right Arm)    Pulse 83    Temp 98.4 F (36.9 C) (Oral)    Resp 16    Ht 5\' 3"  (1.6 m)    Wt 70.8 kg (156 lb)    SpO2 100%    BMI 27.63 kg/m  If your blood pressure (BP) was  elevated above 135/85 this visit, please have this repeated by your doctor within one month. ---------------

## 2017-07-18 ENCOUNTER — Ambulatory Visit
Admission: RE | Admit: 2017-07-18 | Discharge: 2017-07-18 | Disposition: A | Discharge status: Home / Self Care | Payer: Commercial Managed Care - PPO | Source: Ambulatory Visit | Attending: Family Medicine | Admitting: Family Medicine

## 2017-07-18 ENCOUNTER — Other Ambulatory Visit: Payer: Self-pay | Admitting: Family Medicine

## 2017-07-18 DIAGNOSIS — R109 Unspecified abdominal pain: Secondary | ICD-10-CM

## 2017-07-18 DIAGNOSIS — R319 Hematuria, unspecified: Secondary | ICD-10-CM

## 2017-07-18 LAB — URINE CULTURE: Culture: >=100000 — AB

## 2017-07-19 ENCOUNTER — Telehealth: Payer: Self-pay | Admitting: *Deleted

## 2017-07-19 NOTE — Telephone Encounter (Signed)
Post ED Visit - Positive Culture Follow-up  Culture report reviewed by antimicrobial stewardship pharmacist:  []  Enzo BiNathan Batchelder, Pharm.D. []  Celedonio MiyamotoJeremy Frens, Pharm.D., BCPS AQ-ID []  Garvin FilaMike Maccia, Pharm.D., BCPS []  Georgina PillionElizabeth Martin, Pharm.D., BCPS []  [place]h Pham, Vermont[place]arm.D., BCPS, AAHIVP []  Estella HuskMichelle Turner, Pharm.D., BCPS, AAHIVP [x]  Lysle Pearlachel Rumbarger, PharmD, BCPS []  Phillips Climeshuy Dang, PharmD, BCPS []  Agapito GamesAlison Masters, PharmD, BCPS []  Verlan FriendsErin Deja, PharmD  Positive urine culture Treated with Sulfamethoxazole-Trimethoprim, organism sensitive to the same and no further patient follow-up is required at this time.  Virl AxeRobertson, Kimberly [place] [facility] 07/19/2017, 9:54 AM

## 2017-11-21 DIAGNOSIS — Z23 Encounter for immunization: Secondary | ICD-10-CM | POA: Diagnosis not present

## 2018-01-04 DIAGNOSIS — J111 Influenza due to unidentified influenza virus with other respiratory manifestations: Secondary | ICD-10-CM | POA: Diagnosis not present

## 2018-01-04 DIAGNOSIS — R05 Cough: Secondary | ICD-10-CM | POA: Diagnosis not present

## 2018-01-30 DIAGNOSIS — M1990 Unspecified osteoarthritis, unspecified site: Secondary | ICD-10-CM | POA: Diagnosis not present

## 2018-01-30 DIAGNOSIS — Z0000 Encounter for general adult medical examination without abnormal findings: Secondary | ICD-10-CM | POA: Diagnosis not present

## 2018-01-30 DIAGNOSIS — E7800 Pure hypercholesterolemia, unspecified: Secondary | ICD-10-CM | POA: Diagnosis not present

## 2018-01-30 DIAGNOSIS — I10 Essential (primary) hypertension: Secondary | ICD-10-CM | POA: Diagnosis not present

## 2018-01-30 DIAGNOSIS — Z1159 Encounter for screening for other viral diseases: Secondary | ICD-10-CM | POA: Diagnosis not present

## 2018-01-30 DIAGNOSIS — E559 Vitamin D deficiency, unspecified: Secondary | ICD-10-CM | POA: Diagnosis not present

## 2018-02-25 ENCOUNTER — Other Ambulatory Visit: Payer: Self-pay | Admitting: Family Medicine

## 2018-02-25 DIAGNOSIS — Z1231 Encounter for screening mammogram for malignant neoplasm of breast: Secondary | ICD-10-CM

## 2018-03-25 ENCOUNTER — Other Ambulatory Visit: Payer: Self-pay

## 2018-03-25 ENCOUNTER — Ambulatory Visit
Admission: RE | Admit: 2018-03-25 | Discharge: 2018-03-25 | Disposition: A | Discharge status: Home / Self Care | Payer: Commercial Managed Care - PPO | Source: Ambulatory Visit | Attending: Family Medicine | Admitting: Family Medicine

## 2018-03-25 DIAGNOSIS — Z1231 Encounter for screening mammogram for malignant neoplasm of breast: Secondary | ICD-10-CM | POA: Diagnosis not present

## 2018-05-07 ENCOUNTER — Telehealth: Payer: Self-pay | Admitting: *Deleted

## 2018-05-07 NOTE — Telephone Encounter (Signed)
LVM advising that due to current COVID 19 pandemic, our office is severely reducing in person visits in order to minimize the risk to our patients and healthcare providers. We recommend to convert your appointment to a video visit. Advised her appt can be moved sooner. Requested she call back to discuss.

## 2018-05-14 NOTE — Telephone Encounter (Signed)
Called patient and advised her that due to current COVID 19 pandemic, our office is severely reducing in person visits in order to minimize the risk to our patients and healthcare providers. We recommend to convert your appointment to a video visit. She stated she has no means for a video visit. I advised her we can schedule as tele visit. 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 Rescheduled to late June; she verbalized understanding, appreciation.

## 2018-05-14 NOTE — Telephone Encounter (Signed)
Patient is returning your call.  

## 2018-06-05 ENCOUNTER — Ambulatory Visit: Payer: Managed Care, Other (non HMO) | Admitting: Diagnostic Neuroimaging

## 2018-07-08 ENCOUNTER — Encounter: Payer: Self-pay | Admitting: *Deleted

## 2018-07-08 NOTE — Addendum Note (Signed)
Addended by: Florian Buff C on: 07/08/2018 11:07 AM   Modules accepted: Orders

## 2018-07-08 NOTE — Telephone Encounter (Signed)
Spoke with patient and updated EMR. 

## 2018-07-09 ENCOUNTER — Other Ambulatory Visit: Payer: Self-pay

## 2018-07-09 ENCOUNTER — Ambulatory Visit (INDEPENDENT_AMBULATORY_CARE_PROVIDER_SITE_OTHER): Payer: Commercial Managed Care - PPO | Admitting: Diagnostic Neuroimaging

## 2018-07-09 DIAGNOSIS — G500 Trigeminal neuralgia: Secondary | ICD-10-CM | POA: Diagnosis not present

## 2018-07-09 DIAGNOSIS — G43109 Migraine with aura, not intractable, without status migrainosus: Secondary | ICD-10-CM

## 2018-07-09 MED ORDER — AMITRIPTYLINE HCL 25 MG PO TABS: 25.0000 mg | ORAL_TABLET | Freq: Every day | ORAL | 12 refills | Status: DC

## 2018-07-09 MED ORDER — SUMATRIPTAN SUCCINATE 100 MG PO TABS: 100.0000 mg | ORAL_TABLET | Freq: Once | ORAL | 12 refills | Status: DC | PRN

## 2018-07-09 MED ORDER — CARBAMAZEPINE ER 200 MG PO TB12: 200.0000 mg | ORAL_TABLET | Freq: Two times a day (BID) | ORAL | 4 refills | Status: DC

## 2018-07-09 NOTE — Progress Notes (Signed)
     Virtual Visit via Telephone Note  I connected with@ on 07/09/18 at  9:00 AM EDT by telephone and verified that I am speaking with the correct person using two identifiers.   I discussed the limitations, risks, security and privacy concerns of performing an evaluation and management service by telephone and the availability of in person appointments. I also discussed with the patient that there may be a patient responsible charge related to this service. The patient expressed understanding and agreed to proceed. Patient is at home and I am at the office.    History of Present Illness:  - since last visit, TN symptoms are improved. Now on CBZ 200mg  twice a day. - continues with HA, with more stress in her life; has been off amitriptyline due to sedation; now on more Excedrin almost daily    Observations/Objective:  - awake and alert   Assessment and Plan:  Dx:  1. Trigeminal neuralgia   2. Migraine with aura and without status migrainosus, not intractable      TRIGEMINAL NEURALGIA - continue carbamazepine to 200mg  twice a day for trigeminal neuralgia  MIGRAINE WITH AURA - consider restarting amitriptyline 25mg  at bedtime for migraine prevention - continue sumatriptan as needed severe migraine  Meds ordered this encounter  Medications  . carbamazepine (TEGRETOL XR) 200 MG 12 hr tablet    Sig: Take 1 tablet (200 mg total) by mouth 2 (two) times daily.    Dispense:  360 tablet    Refill:  4  . SUMAtriptan (IMITREX) 100 MG tablet    Sig: Take 1 tablet (100 mg total) by mouth once as needed for migraine. May repeat x 1 after 2 hours; maximum 2 tabs per day and 8 tabs per month    Dispense:  8 tablet    Refill:  12  . amitriptyline (ELAVIL) 25 MG tablet    Sig: Take 1 tablet (25 mg total) by mouth at bedtime.    Dispense:  30 tablet    Refill:  12    Follow Up Instructions:  - Return in about 8 months (around 03/09/2019).    I discussed the assessment and  treatment plan with the patient. The patient was provided an opportunity to ask questions and all were answered. The patient agreed with the plan and demonstrated an understanding of the instructions.   The patient was advised to call back or seek an in-person evaluation if the symptoms worsen or if the condition fails to improve as anticipated.  I provided 15 minutes of non-face-to-face time during this encounter.    Penni Bombard, MD 06/15/3014, 0:10 AM Certified in Neurology, Neurophysiology and Neuroimaging  [facility] [address], [place], [place] [address] [phone]

## 2019-01-13 ENCOUNTER — Telehealth: Payer: Self-pay | Admitting: Diagnostic Neuroimaging

## 2019-01-13 NOTE — Telephone Encounter (Signed)
Carbamazepine refilled x 1 year June 2020; patient needs to call and schedule follow up. Note to pharmacy advise. Refill refused.

## 2019-01-15 MED ORDER — CARBAMAZEPINE ER 200 MG PO TB12: 200.0000 mg | ORAL_TABLET | Freq: Two times a day (BID) | ORAL | 1 refills | Status: DC

## 2019-01-15 NOTE — Telephone Encounter (Signed)
Carbamazepine refilled x 6 months. She has FU in June 2021.

## 2019-01-15 NOTE — Telephone Encounter (Signed)
Patient called in and stated the pharmacy is stating the one they have on file expired May 2020.

## 2019-01-15 NOTE — Addendum Note (Signed)
Addended by: [place] Pink on: 01/15/2019 04:28 PM   Modules accepted: Orders

## 2019-02-04 ENCOUNTER — Other Ambulatory Visit: Payer: Self-pay | Admitting: Family Medicine

## 2019-02-04 DIAGNOSIS — M8580 Other specified disorders of bone density and structure, unspecified site: Secondary | ICD-10-CM

## 2019-04-07 DIAGNOSIS — R519 Headache, unspecified: Secondary | ICD-10-CM | POA: Diagnosis not present

## 2019-04-07 DIAGNOSIS — I10 Essential (primary) hypertension: Secondary | ICD-10-CM | POA: Diagnosis not present

## 2019-04-07 DIAGNOSIS — Z7189 Other specified counseling: Secondary | ICD-10-CM | POA: Diagnosis not present

## 2019-04-17 ENCOUNTER — Ambulatory Visit
Admission: RE | Admit: 2019-04-17 | Discharge: 2019-04-17 | Disposition: A | Discharge status: Home / Self Care | Payer: PPO | Source: Ambulatory Visit | Attending: Family Medicine | Admitting: Family Medicine

## 2019-04-17 ENCOUNTER — Other Ambulatory Visit: Payer: Self-pay

## 2019-04-17 DIAGNOSIS — M8589 Other specified disorders of bone density and structure, multiple sites: Secondary | ICD-10-CM | POA: Diagnosis not present

## 2019-04-17 DIAGNOSIS — M8580 Other specified disorders of bone density and structure, unspecified site: Secondary | ICD-10-CM

## 2019-04-17 DIAGNOSIS — Z780 Asymptomatic menopausal state: Secondary | ICD-10-CM | POA: Diagnosis not present

## 2019-06-19 DIAGNOSIS — R519 Headache, unspecified: Secondary | ICD-10-CM | POA: Diagnosis not present

## 2019-06-19 DIAGNOSIS — I10 Essential (primary) hypertension: Secondary | ICD-10-CM | POA: Diagnosis not present

## 2019-06-30 ENCOUNTER — Other Ambulatory Visit: Payer: Self-pay

## 2019-06-30 ENCOUNTER — Encounter: Payer: Self-pay | Admitting: Diagnostic Neuroimaging

## 2019-06-30 ENCOUNTER — Telehealth: Payer: Self-pay | Admitting: *Deleted

## 2019-06-30 ENCOUNTER — Ambulatory Visit: Payer: PPO | Admitting: Diagnostic Neuroimaging

## 2019-06-30 VITALS — BP 130/70 | HR 92 | Ht 63.0 in | Wt 169.4 lb

## 2019-06-30 DIAGNOSIS — G43109 Migraine with aura, not intractable, without status migrainosus: Secondary | ICD-10-CM

## 2019-06-30 DIAGNOSIS — G500 Trigeminal neuralgia: Secondary | ICD-10-CM | POA: Diagnosis not present

## 2019-06-30 MED ORDER — NURTEC 75 MG PO TBDP: 75.0000 mg | ORAL_TABLET | Freq: Every day | ORAL | 6 refills | Status: DC | PRN

## 2019-06-30 MED ORDER — AJOVY 225 MG/1.5ML ~~LOC~~ SOAJ: 225.0000 mg | SUBCUTANEOUS | 4 refills | Status: DC

## 2019-06-30 MED ORDER — SUMATRIPTAN SUCCINATE 100 MG PO TABS: 100.0000 mg | ORAL_TABLET | Freq: Once | ORAL | 12 refills | Status: DC | PRN

## 2019-06-30 NOTE — Patient Instructions (Signed)
TRIGEMINAL NEURALGIA - continue carbamazepine to 200mg  twice a day for trigeminal neuralgia  MIGRAINE PREVENTION  LIFESTYLE CHANGES -Stop or avoid smoking -Decrease or avoid caffeine / alcohol -Eat and sleep on a regular schedule -Exercise several times per week - stop amitriptyline not effective - galazanezumab (Emgality) 240mg  loading dose; then 120mg  monthly  MIGRAINE RESCUE  - ibuprofen, tylenol, sumatriptan as needed - start rimegepant (Nurtec) 75mg  as needed for breakthrough headache; max 8 per month

## 2019-06-30 NOTE — Telephone Encounter (Signed)
Ajovy PA, key: [id number]. [id number]. failed : amitriptyline, topamax. [employer] has received your information, and the request will be reviewed.  You will receive an electronic determination in CoverMyMeds. You will also receive a faxed copy of the determination. If you have any questions please contact [employer] at [phone].

## 2019-06-30 NOTE — Progress Notes (Signed)
Chief Complaint  Patient presents with  . Trigeminal neuralgia    rm 7, one year FU "no major episodes of pain, not needed to increase dose of  carbamazepine; having just as many headaches even on amitriptyline"    History of Present Illness:  UPDATE (06/30/19, VRP): Since last visit, doing well. TN sxs are better. HA are worse (daily migraine and tension HA). Amitriptyline not helping. Off nadolol now; planning to start metoprolol.   UPDATE (07/09/18, VRP):  - since last visit, TN symptoms are improved. Now on CBZ 200mg  twice a day. - continues with HA, with more stress in her life; has been off amitriptyline due to sedation; now on more Excedrin almost daily  UPDATE (05/14/17, VRP): Since last visit, doing well. Tolerating meds. No alleviating or aggravating factors. HA are stable. TN pain is mild, but still there, esp with brushing, touching or eating.  UPDATE 05/08/16 (VRP): Since last visit, doing well. MRI brain was unremarkable. Right lower facial numbness improved. Now on CBZ 400mg  twice a day. Had 2 flare ups of pain (each lasting 1 week), and now better. Still with intermittent HA. Using OTC meds and rx meds. Still with some stress issues.   UPDATE 11/03/15 (VRP): Since last visit sxs increased 3 weeks ago. Pt incr CBZ and sxs improved. Also with intermittent right lower facial numbness in last few months.   UPDATE 05/06/15 (VRP): Since last visit, overall stable. Some pulling right ear sensation. No severe right TN pain. HA are stable. Tolerating CBZ 200mg  BID + amitriptyline 25mg  qhs.   UPDATE 10/22/14 (VRP): Since last visit, no TN pain. Still with 4 headaches per week. Amitriptyline is helping.   UPDATE 04/16/14 (VRP): Since last visit, continues to have 3-4 tension HA per week, 0-1 migraine per month. Right trigeminal neuralgia pain is resolved, stable on CBZ 400mg  BID. Some ongoing stress. No other triggers. Left knee and hip pain limits physical activity.  UPDATE  10/09/13 (LL): Since last visit, she weaned off Topamax.  She does not notice any change in the frequency of headaches. States she continues to have 4 out of 7 days with a tension type headache.  She usually takes Excedrin on these days. Reduced her CBZ to 400 mg twice daily. Having less frequent severe Migraines, maybe 1-2 per month, in which she takes Imitrex for relief.  Sometimes takes 2 doses to feel better.  UPDATE 07/10/13 (LL): Since last visit, no facial pain or vibration. She thinks Topamax makes her more tired. Having just as many headaches, still 3-4 days per week. Sumatriptan helpful, takes Excedrin for milder headaches. Would like to wean off some medication if possible.   UPDATE 04/24/13 (LL): Since last visit, trigeminal neuralgia is stable on CBZ, but when dose is wearing off she feels "vibration" sensation sometimes on right side of mouth. Migraines are worse, averaging 3-4 days per week. Sometimes wakes up with one in morning, sometimes comes on later in day. Most often bi-temporal or behind eyes. Sometimes in occipital region. At worst with nausea and vomiting. Positive for hyperosmia. Migraines last 4-6 hours. Sometimes relived with Excedrin. Imitrex will stop severe headache in usually an hour.   UPDATE 10/23/12: Since last visit, doing about the same. Baclofen 10mg  BID did not help that much more than carbamazepine 600mg  BID alone. Migraines worse for a while, but better with starting HCTZ and reducing Excedrin use.   UPDATE 04/09/12: Since last visit, doing about the same. Has not tried baclofen yet.  Taking carbamazepine 600 mg twice a day. Still having some mild twinges of numbness and tingling on the right upper lip. No severe painful attacks.   UPDATE 09/04/11: Doing better. Tolerating CBZ 600mg  BID. Uses baclofen prn. No side effects. Mild twinges of aches, but no severe pain.   UPDATE 05/01/11: Doing well. Better on CBZ. Mild cognitive side effects. Fell 4 weeks ago from  05/03/11, broke left foot, but doesn't think it was medication related.   PRIOR HPI: 64 year old right-handed female with history of hypertension, migraine headache, here for evaluation of right facial pain. Patient has had intermittent episodes of electrical and lightening, shooting pain in her right jaw for over 10 years. Previous episodes lasted for only a few weeks at a time. She describes brief, electrical, radiating flashes of pain in her right jaw, up towards her right eye, and deeper in her neck and inside her ear. She is been evaluated by her dentist numerous times over the years without a specific dental pathology to explain these symptoms. She does have a history of dental procedures and crowns on her right side. Since 02/04/2011, patient's symptoms have been more severe and persistent. Has tried hydrocodone, gabapentin, without relief. She denies any numbness, weakness, slurred speech, vision problems. She has a first cousin who was diagnosed with multiple sclerosis and is deceased now.     Observations/Objective:  GENERAL EXAM/CONSTITUTIONAL: Vitals:  Vitals:   06/30/19 1344  BP: 130/70  Pulse: 92  Weight: 169 lb 6.4 oz (76.8 kg)  Height: 5\' 3"  (1.6 m)     Body mass index is 30.01 kg/m. Wt Readings from Last 3 Encounters:  06/30/19 169 lb 6.4 oz (76.8 kg)  07/15/17 156 lb (70.8 kg)  05/14/17 166 lb (75.3 kg)     Patient is in no distress; well developed, nourished and groomed; neck is supple  CARDIOVASCULAR:  Examination of carotid arteries is normal; no carotid bruits  Regular rate and rhythm, no murmurs  Examination of peripheral vascular system by observation and palpation is normal  EYES:  Ophthalmoscopic exam of optic discs and posterior segments is normal; no papilledema or hemorrhages  No exam data present  MUSCULOSKELETAL:  Gait, strength, tone, movements noted in Neurologic exam below  NEUROLOGIC: MENTAL STATUS:  No flowsheet data found.  awake,  alert, oriented to person, place and time  recent and remote memory intact  normal attention and concentration  language fluent, comprehension intact, naming intact  fund of knowledge appropriate  CRANIAL NERVE:   2nd - no papilledema on fundoscopic exam  2nd, 3rd, 4th, 6th - pupils equal and reactive to light, visual fields full to confrontation, extraocular muscles intact, no nystagmus  5th - facial sensation symmetric  7th - facial strength symmetric  8th - hearing intact  9th - palate elevates symmetrically, uvula midline  11th - shoulder shrug symmetric  12th - tongue protrusion midline  MOTOR:   normal bulk and tone, full strength in the BUE, BLE  SENSORY:   normal and symmetric to light touch, pinprick, temperature, vibration  COORDINATION:   finger-nose-finger, fine finger movements normal  REFLEXES:   deep tendon reflexes present and symmetric  GAIT/STATION:   narrow based gait    Assessment and Plan:  Dx:  1. Trigeminal neuralgia   2. Migraine with aura and without status migrainosus, not intractable      TRIGEMINAL NEURALGIA - continue carbamazepine to 200mg  twice a day for trigeminal neuralgia  MIGRAINE PREVENTION  LIFESTYLE CHANGES -Stop or avoid  smoking -Decrease or avoid caffeine / alcohol -Eat and sleep on a regular schedule -Exercise several times per week - stop amitriptyline not effective - galazanezumab (Emgality) 240mg  loading dose; then 120mg  monthly  MIGRAINE RESCUE  - ibuprofen, tylenol, sumatriptan as needed - start rimegepant (Nurtec) 75mg  as needed for breakthrough headache; max 8 per month   Meds ordered this encounter  Medications  . Fremanezumab-vfrm (AJOVY) 225 MG/1.5ML SOAJ    Sig: Inject 225 mg into the skin every 30 (thirty) days.    Dispense:  3 pen    Refill:  4  . Rimegepant Sulfate (NURTEC) 75 MG TBDP    Sig: Take 75 mg by mouth daily as needed.    Dispense:  8 tablet    Refill:  6  .  SUMAtriptan (IMITREX) 100 MG tablet    Sig: Take 1 tablet (100 mg total) by mouth once as needed for migraine. May repeat x 1 after 2 hours; maximum 2 tabs per day and 8 tabs per month    Dispense:  8 tablet    Refill:  12    Follow Up Instructions:  - Return in about 1 year (around 06/29/2020) for with NP (Amy Lomax).      , MD 06/30/2019, 2:10 PM Certified in Neurology, Neurophysiology and Neuroimaging  [facility] [address] [place], [facility] [address] [phone]

## 2019-07-01 NOTE — Telephone Encounter (Addendum)
[employer] approved Ajovy through 01/09/2020. Approval letter faxed to [facility].

## 2019-07-21 ENCOUNTER — Other Ambulatory Visit: Payer: Self-pay | Admitting: Diagnostic Neuroimaging

## 2019-07-21 DIAGNOSIS — G500 Trigeminal neuralgia: Secondary | ICD-10-CM

## 2019-08-19 DIAGNOSIS — I10 Essential (primary) hypertension: Secondary | ICD-10-CM | POA: Diagnosis not present

## 2019-10-27 ENCOUNTER — Ambulatory Visit: Payer: PPO | Admitting: Orthopedic Surgery

## 2019-10-27 DIAGNOSIS — M65842 Other synovitis and tenosynovitis, left hand: Secondary | ICD-10-CM

## 2019-10-29 ENCOUNTER — Encounter: Payer: Self-pay | Admitting: Orthopedic Surgery

## 2019-10-29 DIAGNOSIS — M65842 Other synovitis and tenosynovitis, left hand: Secondary | ICD-10-CM | POA: Diagnosis not present

## 2019-10-29 MED ORDER — LIDOCAINE HCL 1 % IJ SOLN
3.0000 mL | INTRAMUSCULAR | Status: AC | PRN
  Administered 2019-10-29: 3 mL

## 2019-10-29 MED ORDER — BUPIVACAINE HCL 0.25 % IJ SOLN
0.3300 mL | INTRAMUSCULAR | Status: AC | PRN
  Administered 2019-10-29: .33 mL

## 2019-10-29 MED ORDER — METHYLPREDNISOLONE ACETATE 40 MG/ML IJ SUSP
13.3300 mg | INTRAMUSCULAR | Status: AC | PRN
  Administered 2019-10-29: 13.33 mg

## 2019-10-29 NOTE — Progress Notes (Signed)
Office Visit Note   Patient: Kathleen [name]           Date of Birth: 27-Jul-1955           MRN: [id number] Visit Date: 10/27/2019 Requested by: Maurice Small, MD [address] [place],  [place] [address] PCP: Maurice Small, MD  Subjective: Chief Complaint  Patient presents with  . Left Hand - Pain    HPI: Kathleen [name] is a 64 year old right-hand-dominant patient with left hand pain.  She localizes the pain at the base of the ring finger.  Radiates up the arm at times.  Denies much in the way of numbness and tingling.  Denies any history of injury.  Reports locking and swelling.  Denies any neck symptoms.  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  She has tried activity modification and ice.  She also takes occasional Excedrin for migraines.  No prior surgery on the left arm.  Symptoms are worsening.  They have been going on now for 4 months.              ROS: All systems reviewed are negative as they relate to the chief complaint within the history of present illness.  Patient denies  fevers or chills.   Assessment & Plan: Visit Diagnoses:  1. Stenosing tenosynovitis of finger of left hand     Plan: Impression is left ring finger stenosing tenosynovitis.  She is tried conservative measures.  I think ultrasound-guided injection gives her a 50-50 chance of taking care of the problem especially since its only been going on for about 3 or 4 months.  That is done today.  We will see her back.  If the injection last 5 or 6 months I will probably consider trying it again but if it does not I think surgical intervention would be the next step.  She does describe daily locking symptoms which puts her in a slightly higher severity category.  Follow-Up Instructions: Return if symptoms worsen or fail to improve.   Orders:  No orders of the defined types were placed in this encounter.  No orders of the defined types were placed in this encounter.     Procedures: Hand/UE Inj: L  ring A1 for trigger finger on 10/29/2019 8:00 AM Indications: therapeutic Details: 25 G needle, volar approach Medications: 0.33 mL bupivacaine 0.25 %; 13.33 mg methylPREDNISolone acetate 40 MG/ML; 3 mL lidocaine 1 % Outcome: tolerated well, no immediate complications Procedure, treatment alternatives, risks and benefits explained, specific risks discussed. Consent was given by the patient. Immediately prior to procedure a time out was called to verify the correct patient, procedure, equipment, support staff and site/side marked as required. Patient was prepped and draped in the usual sterile fashion.       Clinical Data: No additional findings.  Objective: Vital Signs: There were no vitals taken for this visit.  Physical Exam:   Constitutional: Patient appears well-developed HEENT:  Head: Normocephalic Eyes:EOM are normal Neck: Normal range of motion Cardiovascular: Normal rate Pulmonary/chest: Effort normal Neurologic: Patient is alert Skin: Skin is warm Psychiatric: Patient has normal mood and affect    Ortho Exam: Ortho exam of the left hand demonstrates tenderness to palpation over the A1 pulley at the base of the ring finger.  Finger flexion extension intact in all digits.  No masses in the palm.  Radial pulse is intact.  Collateral ligaments are stable in the finger.  Specialty Comments:  No specialty comments available.  Imaging: No results found.   PMFS History: Patient Active Problem List   Diagnosis Date Noted  . Migraine with aura 04/24/2013  . Trigeminal neuralgia 04/09/2012   Past Medical History:  Diagnosis Date  . Headache   . Hypertension   . Trigeminal neuralgia     Family History  Problem Relation Age of Onset  . Atrial fibrillation Mother   . Hypertension Mother   . Arthritis Mother   . Kidney disease Mother   . Heart attack Father   . Bone cancer Other   . Liver disease Sister   . Bipolar disorder Sister     Past Surgical History:    Procedure Laterality Date  . VAGINAL HYSTERECTOMY     Social History   Occupational History    Comment: Surveyor, [employer]  Tobacco Use  . Smoking status: Never Smoker  . Smokeless tobacco: Never Used  Vaping Use  . Vaping Use: Never used  Substance and Sexual Activity  . Alcohol use: No  . Drug use: No  . Sexual activity: Never

## 2019-12-30 DIAGNOSIS — H04123 Dry eye syndrome of bilateral lacrimal glands: Secondary | ICD-10-CM | POA: Diagnosis not present

## 2019-12-30 DIAGNOSIS — H259 Unspecified age-related cataract: Secondary | ICD-10-CM | POA: Diagnosis not present

## 2020-01-14 ENCOUNTER — Telehealth: Payer: Self-pay | Admitting: *Deleted

## 2020-01-14 NOTE — Telephone Encounter (Signed)
Ajovy PA on CMM, key: [id number], [id number]. Received message: available without authorization. Called pharmacy, LVM advising that her Ajovy doesn't require PA.

## 2020-01-19 ENCOUNTER — Other Ambulatory Visit: Payer: Self-pay | Admitting: Diagnostic Neuroimaging

## 2020-01-19 DIAGNOSIS — G500 Trigeminal neuralgia: Secondary | ICD-10-CM

## 2020-02-23 ENCOUNTER — Other Ambulatory Visit: Payer: Self-pay | Admitting: Family Medicine

## 2020-02-23 DIAGNOSIS — K219 Gastro-esophageal reflux disease without esophagitis: Secondary | ICD-10-CM | POA: Diagnosis not present

## 2020-02-23 DIAGNOSIS — Z0000 Encounter for general adult medical examination without abnormal findings: Secondary | ICD-10-CM | POA: Diagnosis not present

## 2020-02-23 DIAGNOSIS — M8580 Other specified disorders of bone density and structure, unspecified site: Secondary | ICD-10-CM | POA: Diagnosis not present

## 2020-02-23 DIAGNOSIS — R519 Headache, unspecified: Secondary | ICD-10-CM | POA: Diagnosis not present

## 2020-02-23 DIAGNOSIS — Z23 Encounter for immunization: Secondary | ICD-10-CM | POA: Diagnosis not present

## 2020-02-23 DIAGNOSIS — K5730 Diverticulosis of large intestine without perforation or abscess without bleeding: Secondary | ICD-10-CM | POA: Diagnosis not present

## 2020-02-23 DIAGNOSIS — E559 Vitamin D deficiency, unspecified: Secondary | ICD-10-CM | POA: Diagnosis not present

## 2020-02-23 DIAGNOSIS — E7800 Pure hypercholesterolemia, unspecified: Secondary | ICD-10-CM | POA: Diagnosis not present

## 2020-02-23 DIAGNOSIS — G500 Trigeminal neuralgia: Secondary | ICD-10-CM | POA: Diagnosis not present

## 2020-02-23 DIAGNOSIS — Z1231 Encounter for screening mammogram for malignant neoplasm of breast: Secondary | ICD-10-CM

## 2020-02-23 DIAGNOSIS — M1990 Unspecified osteoarthritis, unspecified site: Secondary | ICD-10-CM | POA: Diagnosis not present

## 2020-02-23 DIAGNOSIS — I10 Essential (primary) hypertension: Secondary | ICD-10-CM | POA: Diagnosis not present

## 2020-02-26 DIAGNOSIS — H2511 Age-related nuclear cataract, right eye: Secondary | ICD-10-CM | POA: Diagnosis not present

## 2020-03-29 DIAGNOSIS — Z01818 Encounter for other preprocedural examination: Secondary | ICD-10-CM | POA: Diagnosis not present

## 2020-03-29 DIAGNOSIS — H2511 Age-related nuclear cataract, right eye: Secondary | ICD-10-CM | POA: Diagnosis not present

## 2020-04-16 ENCOUNTER — Other Ambulatory Visit: Payer: Self-pay

## 2020-04-16 ENCOUNTER — Ambulatory Visit
Admission: RE | Admit: 2020-04-16 | Discharge: 2020-04-16 | Disposition: A | Discharge status: Home / Self Care | Payer: PPO | Source: Ambulatory Visit | Attending: Family Medicine | Admitting: Family Medicine

## 2020-04-16 DIAGNOSIS — Z1231 Encounter for screening mammogram for malignant neoplasm of breast: Secondary | ICD-10-CM

## 2020-04-19 DIAGNOSIS — H2512 Age-related nuclear cataract, left eye: Secondary | ICD-10-CM | POA: Diagnosis not present

## 2020-07-05 ENCOUNTER — Encounter: Payer: Self-pay | Admitting: Family Medicine

## 2020-07-05 ENCOUNTER — Ambulatory Visit: Payer: PPO | Admitting: Family Medicine

## 2020-07-05 VITALS — BP 136/85 | HR 103 | Ht 63.0 in | Wt 169.8 lb

## 2020-07-05 DIAGNOSIS — G500 Trigeminal neuralgia: Secondary | ICD-10-CM | POA: Diagnosis not present

## 2020-07-05 DIAGNOSIS — G43109 Migraine with aura, not intractable, without status migrainosus: Secondary | ICD-10-CM | POA: Diagnosis not present

## 2020-07-05 MED ORDER — EMGALITY 120 MG/ML ~~LOC~~ SOAJ: 120.0000 mg | SUBCUTANEOUS | 3 refills | Status: DC

## 2020-07-05 MED ORDER — EMGALITY 120 MG/ML ~~LOC~~ SOAJ: 120.0000 mg | SUBCUTANEOUS | 0 refills | Status: DC

## 2020-07-05 MED ORDER — SUMATRIPTAN SUCCINATE 100 MG PO TABS: 100.0000 mg | ORAL_TABLET | Freq: Once | ORAL | 12 refills | Status: DC | PRN

## 2020-07-05 NOTE — Progress Notes (Signed)
Chief Complaint  Patient presents with   Follow-up    RM 1, alone here for migraine f/u, pt [name] HA has gotten worse, has had to use all 8 of her imitrex pills, 4-5 HA a week, she states being under lots of stress. States her trigeminal is under control.      HISTORY OF PRESENT ILLNESS: 07/05/20 ALL:  Kathleen [name] is a 65 y.o. female here today for follow up for trigeminal neuralgia and migraines. She continues carbamazepine 200mg  BID. TN pain is well managed. Very rare pain. She is having more headaches, recently. She has near daily headaches. Ajovy started 06/2019 for migraines but she never picked up injections. Sumatriptan continued for abortive therapy. It usually works well but she has had to use all 8 tablets each month. She takes Tylenol Tension headache at least 20 days a month. Nurtec was too expensive. She has a dull achy pain in the frontal/retro orbital region. She is has nausea. She has visual aura about once a month. Rest helps. 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 Amitriptyline helped her sleep but did not help headaches. She has annual physical with PCP. Labs have reportedly been normal. She did change BP meds in the past year. She is taking metoprolol 50mg  daily.    HISTORY (copied from Dr 92 previous note)  UPDATE (06/30/19, VRP): Since last visit, doing well. TN sxs are better. HA are worse (daily migraine and tension HA). Amitriptyline not helping. Off nadolol now; planning to start metoprolol.    UPDATE (07/09/18, VRP):  - since last visit, TN symptoms are improved. Now on CBZ 200mg  twice a day. - continues with HA, with more stress in her life; has been off amitriptyline due to sedation; now on more Excedrin almost daily   UPDATE (05/14/17, VRP): Since last visit, doing well. Tolerating meds. No alleviating or aggravating factors. HA are stable. TN pain is mild, but still there, esp with brushing, touching or  eating.   UPDATE 05/08/16 (VRP): Since last visit, doing well. MRI brain was unremarkable. Right lower facial numbness improved. Now on CBZ 400mg  twice a day. Had 2 flare ups of pain (each lasting 1 week), and now better. Still with intermittent HA. Using OTC meds and rx meds. Still with some stress issues.   UPDATE 11/03/15 (VRP): Since last visit sxs increased 3 weeks ago. Pt incr CBZ and sxs improved. Also with intermittent right lower facial numbness in last few months.   UPDATE 05/06/15 (VRP): Since last visit, overall stable. Some pulling right ear sensation. No severe right TN pain. HA are stable. Tolerating CBZ 200mg  BID + amitriptyline 25mg  qhs.   UPDATE 10/22/14 (VRP): Since last visit, no TN pain. Still with 4 headaches per week. Amitriptyline is helping.   UPDATE 04/16/14 (VRP): Since last visit, continues to have 3-4 tension HA per week, 0-1 migraine per month. Right trigeminal neuralgia pain is resolved, stable on CBZ 400mg  BID. Some ongoing stress. No other triggers. Left knee and hip pain limits physical activity.   UPDATE 10/09/13 (LL): Since last visit, she weaned off Topamax.  She does not notice any change in the frequency of headaches. States she continues to have 4 out of 7 days with a tension type headache.  She usually takes Excedrin on these days. Reduced her CBZ to 400 mg twice daily. Having less frequent severe Migraines, maybe 1-2 per month, in which she takes Imitrex for  relief.  Sometimes takes 2 doses to feel better.   UPDATE 07/10/13 (LL): Since last visit, no facial pain or vibration. She thinks Topamax makes her more tired. Having just as many headaches, still 3-4 days per week. Sumatriptan helpful, takes Excedrin for milder headaches. Would like to wean off some medication if possible.   UPDATE 04/24/13 (LL): Since last visit, trigeminal neuralgia is stable on CBZ, but when dose is wearing off she feels "vibration" sensation sometimes on right side of mouth. Migraines are  worse, averaging 3-4 days per week. Sometimes wakes up with one in morning, sometimes comes on later in day. Most often bi-temporal or behind eyes. Sometimes in occipital region. At worst with nausea and vomiting. Positive for hyperosmia. Migraines last 4-6 hours. Sometimes relived with Excedrin. Imitrex will stop severe headache in usually an hour.   UPDATE 10/23/12: Since last visit, doing about the same. Baclofen 10mg  BID did not help that much more than carbamazepine 600mg  BID alone. Migraines worse for a while, but better with starting HCTZ and reducing Excedrin use.   UPDATE 04/09/12: Since last visit, doing about the same. Has not tried baclofen yet. Taking carbamazepine 600 mg twice a day. Still having some mild twinges of numbness and tingling on the right upper lip. No severe painful attacks.   UPDATE 09/04/11: Doing better. Tolerating CBZ 600mg  BID. Uses baclofen prn. No side effects. Mild twinges of aches, but no severe pain.   UPDATE 05/01/11: Doing well. Better on CBZ. Mild cognitive side effects. Fell 4 weeks ago from 09/06/11, broke left foot, but doesn't think it was medication related.   PRIOR HPI: 65 year old right-handed female with history of hypertension, migraine headache, here for evaluation of right facial pain. Patient has had intermittent episodes of electrical and lightening, shooting pain in her right jaw for over 10 years. Previous episodes lasted for only a few weeks at a time. She describes brief, electrical, radiating flashes of pain in her right jaw, up towards her right eye, and deeper in her neck and inside her ear. She is been evaluated by her dentist numerous times over the years without a specific dental pathology to explain these symptoms. She does have a history of dental procedures and crowns on her right side. Since 02/04/2011, patient's symptoms have been more severe and persistent. Has tried hydrocodone, gabapentin, without relief. She denies any numbness, weakness,  slurred speech, vision problems. She has a first cousin who was diagnosed with multiple sclerosis and is deceased now.   REVIEW OF SYSTEMS: Out of a complete 14 system review of symptoms, the patient complains only of the following symptoms, and all other reviewed systems are negative.   ALLERGIES: Allergies  Allergen Reactions   Morphine And Related Itching   Penicillins Diarrhea     HOME MEDICATIONS: Outpatient Medications Prior to Visit  Medication Sig Dispense Refill   Acetaminophen-Caffeine (EXCEDRIN TENSION HEADACHE PO) Take by mouth.     calcium carbonate (OS-CAL) 600 MG TABS Take 600 mg by mouth 2 (two) times daily with a meal.     carbamazepine (TEGRETOL XR) 200 MG 12 hr tablet Take 1 tablet by mouth twice daily 180 tablet 1   cholecalciferol (VITAMIN D) 400 units TABS tablet Take 400 Units by mouth.     famotidine (PEPCID) 20 MG tablet Take 20 mg by mouth daily.     Omega-3 Fatty Acids (FISH OIL) 1200 MG CAPS Take by mouth.     amitriptyline (ELAVIL) 25 MG tablet Take 1 tablet (  25 mg total) by mouth at bedtime. 30 tablet 12   Fremanezumab-vfrm (AJOVY) 225 MG/1.5ML SOAJ Inject 225 mg into the skin every 30 (thirty) days. 3 pen 4   hydrochlorothiazide (MICROZIDE) 12.5 MG capsule Take 12.5 mg by mouth daily.     Rimegepant Sulfate (NURTEC) 75 MG TBDP Take 75 mg by mouth daily as needed. 8 tablet 6   SUMAtriptan (IMITREX) 100 MG tablet Take 1 tablet (100 mg total) by mouth once as needed for migraine. May repeat x 1 after 2 hours; maximum 2 tabs per day and 8 tabs per month 8 tablet 12   Vitamin D, Ergocalciferol, (DRISDOL) 50000 units CAPS capsule Take 50,000 Units by mouth every 7 (seven) days.     No facility-administered medications prior to visit.     PAST MEDICAL HISTORY: Past Medical History:  Diagnosis Date   Headache    Hypertension    Trigeminal neuralgia      PAST SURGICAL HISTORY: Past Surgical History:  Procedure Laterality Date   VAGINAL HYSTERECTOMY        FAMILY HISTORY: Family History  Problem Relation Age of Onset   Atrial fibrillation Mother    Hypertension Mother    Arthritis Mother    Kidney disease Mother    Heart attack Father    Bone cancer Other    Liver disease Sister    Bipolar disorder Sister      SOCIAL HISTORY: Social History   Socioeconomic History   Marital status: Married    Spouse name: Harrison MonsBlake   Number of children: 1   Years of education: HS   Highest education level: Not on file  Occupational History    Comment: Surveyor, [employer]  Tobacco Use   Smoking status: Never   Smokeless tobacco: Never  Vaping Use   Vaping Use: Never used  Substance and Sexual Activity   Alcohol use: No   Drug use: No   Sexual activity: Never  Other Topics Concern   Not on file  Social History Narrative   Caffeine Use- She drinks coffee and sodas on occasion.   05/08/16, 07/08/18 Patient lives at home with spouse. Her mother lives there.    Right handed   Social Determinants of Health   Financial Resource Strain: Not on file  Food Insecurity: Not on file  Transportation Needs: Not on file  Physical Activity: Not on file  Stress: Not on file  Social Connections: Not on file  Intimate Partner Violence: Not on file     PHYSICAL EXAM  Vitals:   07/05/20 1307  BP: 136/85  Pulse: (!) 103  Weight: 169 lb 12.8 oz (77 kg)  Height: 5\' 3"  (1.6 m)   Body mass index is 30.08 kg/m.   Generalized: Well developed, in no acute distress  Cardiology: normal rate and rhythm, no murmur auscultated  Respiratory: clear to auscultation bilaterally    Neurological examination  Mentation: Alert oriented to time, place, history taking. Follows all commands speech and language fluent Cranial nerve II-XII: Pupils were equal round reactive to light. Extraocular movements were full, visual field were full on confrontational test. Facial sensation and strength were normal. Uvula tongue midline. Head turning and shoulder shrug   were normal and symmetric. Motor: The motor testing reveals 5 over 5 strength of all 4 extremities. Good symmetric motor tone is noted throughout.  Sensory: Sensory testing is intact to soft touch on all 4 extremities. No evidence of extinction is noted.  Coordination: Cerebellar testing reveals good finger-nose-finger  and heel-to-shin bilaterally.  Gait and station: Gait is normal. Tandem gait is normal. Romberg is negative. No drift is seen.  Reflexes: Deep tendon reflexes are symmetric and normal bilaterally.    DIAGNOSTIC DATA (LABS, IMAGING, TESTING) - I reviewed patient records, labs, notes, testing and imaging myself where available.  No results found for: WBC, HGB, HCT, MCV, PLT No results found for: NA, K, CL, CO2, GLUCOSE, BUN, CREATININE, CALCIUM, PROT, ALBUMIN, AST, ALT, ALKPHOS, BILITOT, GFRNONAA, GFRAA No results found for: CHOL, HDL, LDLCALC, LDLDIRECT, TRIG, CHOLHDL No results found for: XBLT9Q No results found for: VITAMINB12 No results found for: TSH  No flowsheet data found.   No flowsheet data found.   ASSESSMENT AND PLAN  65 y.o. year old female  has a past medical history of Headache, Hypertension, and Trigeminal neuralgia. here with    Trigeminal neuralgia  Migraine with aura and without status migrainosus, not intractable  Kathleen [name] that trigeminal pain is well managed on carbamazepine. We will continue 200mg  twice daily. She will continue labs with PCP. I will add Emgality based on insurance preference for migraine prevention. Loading dose given in the office. Potential side effects and appropraite storage discussed. She will continue sumatriptan and Tylenol Tension as needed but advised against regular use of abortive medicaitons. Healthy lifestyle habits reviewed. She will follow up in 6 months.   No orders of the defined types were placed in this encounter.    Meds ordered this encounter  Medications   Galcanezumab-gnlm (EMGALITY) 120 MG/ML  SOAJ    Sig: Inject 120 mg into the skin every 30 (thirty) days.    Dispense:  3 mL    Refill:  3    Order Specific Question:   Supervising Provider    Answer:   Anson Fret   Galcanezumab-gnlm (EMGALITY) 120 MG/ML SOAJ    Sig: Inject 120 mg into the skin every 30 (thirty) days.    Dispense:  2 mL    Refill:  0    Order Specific Question:   Supervising Provider    Answer:   [[id number]] Anson Fret    Order Specific Question:   Lot Number?    Answer:   [id number]] [id number]    Order Specific Question:   Expiration Date?    Answer:   02/22/2022   SUMAtriptan (IMITREX) 100 MG tablet    Sig: Take 1 tablet (100 mg total) by mouth once as needed for migraine. May repeat x 1 after 2 hours; maximum 2 tabs per day and 8 tabs per month    Dispense:  8 tablet    Refill:  12    Order Specific Question:   Supervising Provider    Answer:   02/24/2022 Anson Fret       [[id number]], MSN, FNP-C 07/05/2020, 1:42 PM  [facility] [address] [place], [place] [phone]

## 2020-07-05 NOTE — Patient Instructions (Addendum)
Below is our plan:  We will start Emgality. You will administer 1 injection every 30 days. We have given the first loading dose of 2 injections in the office, today. Continue sumatriptan for abortive therapy. Avoid regular use of abortive medications.   Please make sure you are staying well hydrated. I recommend 50-60 ounces daily. Well balanced diet and regular exercise encouraged. Consistent sleep schedule with 6-8 hours recommended.   Please continue follow up with care team as directed.   Follow up with me in 6 months   You may receive a survey regarding today's visit. I encourage you to leave honest feed back as I do use this information to improve patient care. Thank you for seeing me today!

## 2020-07-07 ENCOUNTER — Other Ambulatory Visit: Payer: Self-pay | Admitting: Diagnostic Neuroimaging

## 2020-07-07 DIAGNOSIS — G500 Trigeminal neuralgia: Secondary | ICD-10-CM

## 2020-08-23 DIAGNOSIS — G500 Trigeminal neuralgia: Secondary | ICD-10-CM | POA: Diagnosis not present

## 2020-08-23 DIAGNOSIS — I10 Essential (primary) hypertension: Secondary | ICD-10-CM | POA: Diagnosis not present

## 2020-08-23 DIAGNOSIS — E7800 Pure hypercholesterolemia, unspecified: Secondary | ICD-10-CM | POA: Diagnosis not present

## 2020-08-23 DIAGNOSIS — K219 Gastro-esophageal reflux disease without esophagitis: Secondary | ICD-10-CM | POA: Diagnosis not present

## 2020-09-08 ENCOUNTER — Telehealth: Payer: Self-pay | Admitting: Family Medicine

## 2020-09-08 NOTE — Telephone Encounter (Signed)
Called the patient back to get more information. States in May she received the started injection here in the office. She said it burned  little bit initially but then went away.  The next month when she took it she did note that burning sensation at the site (internally) and lasted a day and went away.  This past time she had the injection in abd site and felt the same burning sensation but has noticed that feeling has continued for extended length of time. Last dose was given on Friday and she still feels that sensation internally at the injection site. It is not painful, it did bruise some but nothing else. She states that her headaches have been better controlled on the [facility] but she wanted to make sure safe to continue with her having this feeling lingering longer after injection.  I educated about making sure changing sites when given the injection each month. Advised I would talk with Amy and make her aware this is going on and see if she has thoughts or recommendations on how to move forward.

## 2020-09-08 NOTE — Telephone Encounter (Signed)
Pt called wanting to speak to the RN regarding the burning sensation she feels even days later in the site where she gets her Galcanezumab-gnlm (EMGALITY) 120 MG/ML SOAJ Please advise.

## 2020-09-09 NOTE — Telephone Encounter (Signed)
Called the patient back and advised I spoke with Kathleen [name] about her concerns. Informed her that since she states overall the medication is helping the headaches/migraines and as long as this is toleratable she would recommend continue the injections and montior. Pt admitted that she was giving the injection in the same location that it was given in the office. Advised that each month she should rotate sites and do in a different location. She will do that going forward and continue to monitor.  Offered the patient an option to purchase lidocaine otc to help with numbing the area before she is due for injection. She verbalized understanding and was appreciative for the call back.

## 2020-09-22 ENCOUNTER — Other Ambulatory Visit: Payer: Self-pay | Admitting: Gastroenterology

## 2020-09-22 DIAGNOSIS — R1310 Dysphagia, unspecified: Secondary | ICD-10-CM | POA: Diagnosis not present

## 2020-09-22 DIAGNOSIS — K219 Gastro-esophageal reflux disease without esophagitis: Secondary | ICD-10-CM | POA: Diagnosis not present

## 2020-09-24 ENCOUNTER — Ambulatory Visit
Admission: RE | Admit: 2020-09-24 | Discharge: 2020-09-24 | Disposition: A | Discharge status: Home / Self Care | Payer: PPO | Source: Ambulatory Visit | Attending: Gastroenterology | Admitting: Gastroenterology

## 2020-09-24 ENCOUNTER — Other Ambulatory Visit: Payer: Self-pay | Admitting: Gastroenterology

## 2020-09-24 DIAGNOSIS — R1310 Dysphagia, unspecified: Secondary | ICD-10-CM

## 2020-10-25 ENCOUNTER — Other Ambulatory Visit: Payer: Self-pay | Admitting: Neurology

## 2020-10-25 ENCOUNTER — Telehealth: Payer: Self-pay | Admitting: Family Medicine

## 2020-10-25 DIAGNOSIS — G500 Trigeminal neuralgia: Secondary | ICD-10-CM

## 2020-10-25 MED ORDER — CARBAMAZEPINE ER 200 MG PO TB12: 200.0000 mg | ORAL_TABLET | Freq: Two times a day (BID) | ORAL | 0 refills | Status: DC

## 2020-10-25 NOTE — Telephone Encounter (Signed)
Pt request refill for carbamazepine (TEGRETOL XR) 200 MG 12 hr tablet at [id number]

## 2020-10-25 NOTE — Telephone Encounter (Signed)
I have forwarded the refill for the patient to the pharmacy.

## 2020-10-29 DIAGNOSIS — K2930 Chronic superficial gastritis without bleeding: Secondary | ICD-10-CM | POA: Diagnosis not present

## 2020-10-29 DIAGNOSIS — K2940 Chronic atrophic gastritis without bleeding: Secondary | ICD-10-CM | POA: Diagnosis not present

## 2020-10-29 DIAGNOSIS — K31A19 Gastric intestinal metaplasia without dysplasia, unspecified site: Secondary | ICD-10-CM | POA: Diagnosis not present

## 2020-10-29 DIAGNOSIS — R1310 Dysphagia, unspecified: Secondary | ICD-10-CM | POA: Diagnosis not present

## 2020-10-29 DIAGNOSIS — K219 Gastro-esophageal reflux disease without esophagitis: Secondary | ICD-10-CM | POA: Diagnosis not present

## 2020-11-04 DIAGNOSIS — K2940 Chronic atrophic gastritis without bleeding: Secondary | ICD-10-CM | POA: Diagnosis not present

## 2020-11-04 DIAGNOSIS — K31A19 Gastric intestinal metaplasia without dysplasia, unspecified site: Secondary | ICD-10-CM | POA: Diagnosis not present

## 2020-11-13 DIAGNOSIS — Z23 Encounter for immunization: Secondary | ICD-10-CM | POA: Diagnosis not present

## 2020-12-23 NOTE — Progress Notes (Signed)
Chief Complaint  Patient presents with   Follow-up    Pt alone, rm 2. Pt is follow up today. The emgality is helping with migraines as far as intensity but still continue to have daily headaches. She doesn't know if she wants to continue since not noticing huge benefit. It is costly    HISTORY OF PRESENT ILLNESS: 12/27/20 ALL:  Soila returns for follow up for TN and migraines. She continues carbamazepine  BID. Emgality was started 06/2020 for migraine prevention. She called 08/2020 with concerns of burning with injeciton but otherwise tolerating well. Since, TN pain is well managed. She reports that migraines may be a little better on Emgality but she continues to have daily tension type headaches. Most every day she has some sort of headache. Most start after getting moving during the day. Some do wake her from sleep. She doesn't think she snores. She is taking Excedrin most every day that helps. She continues feel stress is contributing. She is living with her mother who is 35.   07/05/2020 ALL:  LEONDRA CULLIN is a 65 y.o. female here today for follow up for trigeminal neuralgia and migraines. She continues carbamazepine  BID. TN pain is well managed. Very rare pain. She is having more headaches, recently. She has near daily headaches. Ajovy started 06/2019 for migraines but she never picked up injections. Sumatriptan continued for abortive therapy. It usually works well but she has had to use all 8 tablets each month. She takes Tylenol Tension headache at least 20 days a month. Nurtec was too expensive. She has a dull achy pain in the frontal/retro orbital region. She is has nausea. She has visual aura about once a month. Rest helps. 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 Amitriptyline helped her sleep but did not help headaches. She has annual physical with PCP. Labs have reportedly been normal. She did change BP meds in the past year. She  is taking metoprolol  daily.   HISTORY (copied from Dr Richrd Humbles previous note)  UPDATE (06/30/19, VRP): Since last visit, doing well. TN sxs are better. HA are worse (daily migraine and tension HA). Amitriptyline not helping. Off nadolol now; planning to start metoprolol.    UPDATE (07/09/18, VRP):  - since last visit, TN symptoms are improved. Now on CBZ  twice a day. - continues with HA, with more stress in her life; has been off amitriptyline due to sedation; now on more Excedrin almost daily   UPDATE (05/14/17, VRP): Since last visit, doing well. Tolerating meds. No alleviating or aggravating factors. HA are stable. TN pain is mild, but still there, esp with brushing, touching or eating.   UPDATE 05/08/16 (VRP): Since last visit, doing well. MRI brain was unremarkable. Right lower facial numbness improved. Now on CBZ  twice a day. Had 2 flare ups of pain (each lasting 1 week), and now better. Still with intermittent HA. Using OTC meds and rx meds. Still with some stress issues.   UPDATE 11/03/15 (VRP): Since last visit sxs increased 3 weeks ago. Pt incr CBZ and sxs improved. Also with intermittent right lower facial numbness in last few months.   UPDATE 05/06/15 (VRP): Since last visit, overall stable. Some pulling right ear sensation. No severe right TN pain. HA are stable. Tolerating CBZ  BID + amitriptyline  qhs.   UPDATE 10/22/14 (VRP): Since last visit, no TN pain. Still with 4 headaches per week. Amitriptyline is  helping.   UPDATE 04/16/14 (VRP): Since last visit, continues to have 3-4 tension HA per week, 0-1 migraine per month. Right trigeminal neuralgia pain is resolved, stable on CBZ 400mg  BID. Some ongoing stress. No other triggers. Left knee and hip pain limits physical activity.   UPDATE 10/09/13 (LL): Since last visit, she weaned off Topamax.  She does not notice any change in the frequency of headaches. States she continues to have 4 out of 7 days with a  tension type headache.  She usually takes Excedrin on these days. Reduced her CBZ to 400 mg twice daily. Having less frequent severe Migraines, maybe 1-2 per month, in which she takes Imitrex for relief.  Sometimes takes 2 doses to feel better.   UPDATE 07/10/13 (LL): Since last visit, no facial pain or vibration. She thinks Topamax makes her more tired. Having just as many headaches, still 3-4 days per week. Sumatriptan helpful, takes Excedrin for milder headaches. Would like to wean off some medication if possible.   UPDATE 04/24/13 (LL): Since last visit, trigeminal neuralgia is stable on CBZ, but when dose is wearing off she feels "vibration" sensation sometimes on right side of mouth. Migraines are worse, averaging 3-4 days per week. Sometimes wakes up with one in morning, sometimes comes on later in day. Most often bi-temporal or behind eyes. Sometimes in occipital region. At worst with nausea and vomiting. Positive for hyperosmia. Migraines last 4-6 hours. Sometimes relived with Excedrin. Imitrex will stop severe headache in usually an hour.   UPDATE 10/23/12: Since last visit, doing about the same. Baclofen 10mg  BID did not help that much more than carbamazepine 600mg  BID alone. Migraines worse for a while, but better with starting HCTZ and reducing Excedrin use.   UPDATE 04/09/12: Since last visit, doing about the same. Has not tried baclofen yet. Taking carbamazepine 600 mg twice a day. Still having some mild twinges of numbness and tingling on the right upper lip. No severe painful attacks.   UPDATE 09/04/11: Doing better. Tolerating CBZ 600mg  BID. Uses baclofen prn. No side effects. Mild twinges of aches, but no severe pain.   UPDATE 05/01/11: Doing well. Better on CBZ. Mild cognitive side effects. Fell 4 weeks ago from 06/09/12, broke left foot, but doesn't think it was medication related.   PRIOR HPI: 65 year old right-handed female with history of hypertension, migraine headache, here for  evaluation of right facial pain. Patient has had intermittent episodes of electrical and lightening, shooting pain in her right jaw for over 10 years. Previous episodes lasted for only a few weeks at a time. She describes brief, electrical, radiating flashes of pain in her right jaw, up towards her right eye, and deeper in her neck and inside her ear. She is been evaluated by her dentist numerous times over the years without a specific dental pathology to explain these symptoms. She does have a history of dental procedures and crowns on her right side. Since 02/04/2011, patient's symptoms have been more severe and persistent. Has tried hydrocodone, gabapentin, without relief. She denies any numbness, weakness, slurred speech, vision problems. She has a first cousin who was diagnosed with multiple sclerosis and is deceased now.   REVIEW OF SYSTEMS: Out of a complete 14 system review of symptoms, the patient complains only of the following symptoms, headaches, facial pain and all other reviewed systems are negative.   ALLERGIES: Allergies  Allergen Reactions   Morphine And Related Itching   Penicillins Diarrhea     HOME MEDICATIONS: Outpatient  Medications Prior to Visit  Medication Sig Dispense Refill   Acetaminophen-Caffeine (EXCEDRIN TENSION HEADACHE PO) Take by mouth.     Ascorbic Acid (VITAMIN C) 1000 MG tablet 1 tablet     calcium carbonate (OS-CAL) 600 MG TABS Take 600 mg by mouth 2 (two) times daily with a meal.     cholecalciferol (VITAMIN D) 400 units TABS tablet Take 400 Units by mouth.     famotidine (PEPCID) 20 MG tablet Take 20 mg by mouth daily.     Galcanezumab-gnlm (EMGALITY) 120 MG/ML SOAJ Inject 120 mg into the skin every 30 (thirty) days. 3 mL 3   metoprolol succinate (TOPROL-XL) 50 MG 24 hr tablet Take 50 mg by mouth daily.     Omega-3 Fatty Acids (FISH OIL) 1200 MG CAPS Take by mouth.     SUMAtriptan (IMITREX) 100 MG tablet Take 1 tablet (100 mg total) by mouth once as  needed for migraine. May repeat x 1 after 2 hours; maximum 2 tabs per day and 8 tabs per month 8 tablet 12   carbamazepine (TEGRETOL XR) 200 MG 12 hr tablet Take 1 tablet (200 mg total) by mouth 2 (two) times daily. 180 tablet 0   Galcanezumab-gnlm (EMGALITY) 120 MG/ML SOAJ Inject 120 mg into the skin every 30 (thirty) days. 2 mL 0   No facility-administered medications prior to visit.     PAST MEDICAL HISTORY: Past Medical History:  Diagnosis Date   Headache    Hypertension    Trigeminal neuralgia      PAST SURGICAL HISTORY: Past Surgical History:  Procedure Laterality Date   VAGINAL HYSTERECTOMY       FAMILY HISTORY: Family History  Problem Relation Age of Onset   Atrial fibrillation Mother    Hypertension Mother    Arthritis Mother    Kidney disease Mother    Heart attack Father    Bone cancer Other    Liver disease Sister    Bipolar disorder Sister      SOCIAL HISTORY: Social History   Socioeconomic History   Marital status: Married    Spouse name: Harrison Mons   Number of children: 1   Years of education: HS   Highest education level: Not on file  Occupational History    Comment: Surveyor, quantity  Tobacco Use   Smoking status: Never   Smokeless tobacco: Never  Vaping Use   Vaping Use: Never used  Substance and Sexual Activity   Alcohol use: No   Drug use: No   Sexual activity: Never  Other Topics Concern   Not on file  Social History Narrative   Caffeine Use- She drinks coffee and sodas on occasion.   05/08/16, 07/08/18 Patient lives at home with spouse. Her mother lives there.    Right handed   Social Determinants of Health   Financial Resource Strain: Not on file  Food Insecurity: Not on file  Transportation Needs: Not on file  Physical Activity: Not on file  Stress: Not on file  Social Connections: Not on file  Intimate Partner Violence: Not on file     PHYSICAL EXAM  Vitals:   12/27/20 1122  BP: (!) 152/87  Pulse: 76  Weight: 171 lb  (77.6 kg)  Height: 5\' 3"  (1.6 m)    Body mass index is 30.29 kg/m.   Generalized: Well developed, in no acute distress  Cardiology: normal rate and rhythm, no murmur auscultated  Respiratory: clear to auscultation bilaterally    Neurological examination  Mentation: Alert  oriented to time, place, history taking. Follows all commands speech and language fluent Cranial nerve II-XII: Pupils were equal round reactive to light. Extraocular movements were full, visual field were full on confrontational test. Facial sensation and strength were normal. Uvula tongue midline. Head turning and shoulder shrug  were normal and symmetric. Motor: The motor testing reveals 5 over 5 strength of all 4 extremities. Good symmetric motor tone is noted throughout.  Sensory: Sensory testing is intact to soft touch on all 4 extremities. No evidence of extinction is noted.  Coordination: Cerebellar testing reveals good finger-nose-finger and heel-to-shin bilaterally.  Gait and station: Gait is normal. Tandem gait is normal. Romberg is negative. No drift is seen.  Reflexes: Deep tendon reflexes are symmetric and normal bilaterally.    DIAGNOSTIC DATA (LABS, IMAGING, TESTING) - I reviewed patient records, labs, notes, testing and imaging myself where available.  No results found for: WBC, HGB, HCT, MCV, PLT No results found for: NA, K, CL, CO2, GLUCOSE, BUN, CREATININE, CALCIUM, PROT, ALBUMIN, AST, ALT, ALKPHOS, BILITOT, GFRNONAA, GFRAA No results found for: CHOL, HDL, LDLCALC, LDLDIRECT, TRIG, CHOLHDL No results found for: EXBM8U No results found for: VITAMINB12 No results found for: TSH  No flowsheet data found.   No flowsheet data found.   ASSESSMENT AND PLAN  65 y.o. year old female  has a past medical history of Headache, Hypertension, and Trigeminal neuralgia. here with    Trigeminal neuralgia - Plan: carbamazepine (TEGRETOL XR) 200 MG 12 hr tablet  Migraine with aura and without status  migrainosus, not intractable  Aleyda reports that trigeminal pain is well managed on carbamazepine. We will continue 200mg  twice daily. She will continue labs with PCP.  We will continue Emgality for now. I am going to add imipramine 25mg  daily at bedtime. She will continue sumatriptan and Excedrin as needed but advised against regular use of abortive medicaitons. We have discussed considering a sleep eval. No other signs of sleep apnea outside of morning headaches. She will think about this. Healthy lifestyle habits reviewed. She will follow up in 6 months.   No orders of the defined types were placed in this encounter.     Meds ordered this encounter  Medications   imipramine (TOFRANIL) 25 MG tablet    Sig: Take 1 tablet (25 mg total) by mouth at bedtime.    Dispense:  90 tablet    Refill:  3    Order Specific Question:   Supervising Provider    Answer:      carbamazepine (TEGRETOL XR) 200 MG 12 hr tablet    Sig: Take 1 tablet (200 mg total) by mouth 2 (two) times daily.    Dispense:  180 tablet    Refill:  3    Order Specific Question:   Supervising Provider    Answer:   Anson Fret [[id number]]     Anson Fret, MSN, FNP-C 12/27/2020, 12:20 PM  [facility] [address] [place], [address] [place] [phone]

## 2020-12-23 NOTE — Patient Instructions (Signed)
Below is our plan:  We will continue carbamazepine 200mg  twice daily. Continue Emgality for now. We may discontinue pending response to imipramine. We will start imipramine 25mg  daily at bedtime. You can take 1/2 tablet for the first week if you wish. Continue sumatriptan for abortive therapy. Try to wean Excedrin. I recommend decrease by 50% weekly until using only as needed (1-2 times a week).   Please make sure you are staying well hydrated. I recommend 50-60 ounces daily. Well balanced diet and regular exercise encouraged. Consistent sleep schedule with 6-8 hours recommended.   Please continue follow up with care team as directed.   Follow up with me in 3-4 months   You may receive a survey regarding today's visit. I encourage you to leave honest feed back as I do use this information to improve patient care. Thank you for seeing me today!

## 2020-12-27 ENCOUNTER — Ambulatory Visit: Payer: PPO | Admitting: Family Medicine

## 2020-12-27 ENCOUNTER — Encounter: Payer: Self-pay | Admitting: Family Medicine

## 2020-12-27 VITALS — BP 152/87 | HR 76 | Ht 63.0 in | Wt 171.0 lb

## 2020-12-27 DIAGNOSIS — G43109 Migraine with aura, not intractable, without status migrainosus: Secondary | ICD-10-CM | POA: Diagnosis not present

## 2020-12-27 DIAGNOSIS — G500 Trigeminal neuralgia: Secondary | ICD-10-CM | POA: Diagnosis not present

## 2020-12-27 MED ORDER — CARBAMAZEPINE ER 200 MG PO TB12
200.0000 mg | ORAL_TABLET | Freq: Two times a day (BID) | ORAL | 3 refills | Status: DC
Start: 2020-12-27 — End: 2022-01-18

## 2020-12-27 MED ORDER — IMIPRAMINE HCL 25 MG PO TABS: 25.0000 mg | ORAL_TABLET | Freq: Every day | ORAL | 3 refills | Status: DC

## 2021-01-28 ENCOUNTER — Other Ambulatory Visit: Payer: Self-pay

## 2021-01-28 ENCOUNTER — Ambulatory Visit (INDEPENDENT_AMBULATORY_CARE_PROVIDER_SITE_OTHER): Payer: PPO

## 2021-01-28 ENCOUNTER — Ambulatory Visit (INDEPENDENT_AMBULATORY_CARE_PROVIDER_SITE_OTHER): Payer: PPO | Admitting: Surgical

## 2021-01-28 DIAGNOSIS — M25571 Pain in right ankle and joints of right foot: Secondary | ICD-10-CM | POA: Diagnosis not present

## 2021-01-28 MED ORDER — PREDNISONE 5 MG (21) PO TBPK: ORAL_TABLET | ORAL | 0 refills | Status: DC

## 2021-01-30 ENCOUNTER — Encounter: Payer: Self-pay | Admitting: Surgical

## 2021-01-30 NOTE — Progress Notes (Signed)
Office Visit Note   Patient: Kathleen [name]           Date of Birth: Apr 30, 1955           MRN: [id number] Visit Date: 01/28/2021 Requested by: Maurice Small, MD [address] [place],  [place] [address] PCP: Maurice Small, MD  Subjective: Chief Complaint  Patient presents with   Right Ankle - Pain    HPI: Kathleen [name] is a 67 y.o. female who presents to the office complaining of right ankle pain over the last month.  Patient complains of medial sided ankle pain that she began to notice around just before Christmas last year.  She denies any history of injury or any previous problem with her right ankle.  Pain is worst in the morning when she gets out of bed.  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  Pain is worse with ambulation and does not really bother her at rest.  She has tried ice, Advil without much relief.  Has not tried any orthotics, topicals, other interventions.  Denies any radiation of pain or numbness/tingling..                ROS: All systems reviewed are negative as they relate to the chief complaint within the history of present illness.  Patient denies fevers or chills.  Assessment & Plan: Visit Diagnoses:  1. Pain in right ankle and joints of right foot     Plan: Patient is a 66 year old female who presents for evaluation of right ankle pain.  She has had no known injury with onset of pain about a month ago.  Radiographs today are negative for any acute injury or any severe ankle arthritis.  Impression is posterior tib tendinitis.  She is able to perform single-leg heel raise and has no severe loss of arch height compared with the contralateral side though she does have pes planus deformity at baseline.  Plan is Medrol Dosepak for symptomatic relief and ibuprofen scheduled to follow this.  She will use a posterior tib tendon dysfunction brace at all times when ambulatory.  Follow-up in 2  weeks for clinical recheck with Dr. August Saucer.  If no improvement at that point, consider MRI of the right ankle for further evaluation  Follow-Up Instructions: No follow-ups on file.   Orders:  Orders Placed This Encounter  Procedures   XR Ankle Complete Right   Meds ordered this encounter  Medications   predniSONE (STERAPRED UNI-PAK 21 TAB) 5 MG (21) TBPK tablet    Sig: Take dosepak as directed    Dispense:  21 tablet    Refill:  0      Procedures: No procedures performed   Clinical Data: No additional findings.  Objective: Vital Signs: There were no vitals taken for this visit.  Physical Exam:  Constitutional: Patient appears well-developed HEENT:  Head: Normocephalic Eyes:EOM are normal Neck: Normal range of motion Cardiovascular: Normal rate Pulmonary/chest: Effort normal Neurologic: Patient is alert Skin: Skin is warm Psychiatric: Patient has normal mood and affect  Ortho Exam: Ortho exam demonstrates right ankle with intact dorsiflexion, plantarflexion, inversion, eversion.  There is some mild swelling over the medial aspect of the ankle near the insertion of the posterior tib tendon.  Patient has bilateral pes planus deformity.  Mildly increased hindfoot valgus of the right ankle compared with the left ankle when patient is standing.  Tender mildly over the  deltoid ligament and the posterior tib tendon insertion.  No tenderness over the ATFL, CFL, lateral malleolus, Achilles tendon, Achilles tendon insertion, fifth metatarsal base, Lisfranc complex, plantar fascia.  Patient is able to perform single-leg heel raise with both legs; no pain on the left leg but she does have reproduction of her right ankle pain when she performs this on her right leg.  Specialty Comments:  No specialty comments available.  Imaging: No results found.   PMFS History: Patient Active Problem List   Diagnosis Date Noted   Migraine with aura 04/24/2013   Trigeminal neuralgia 04/09/2012    Past Medical History:  Diagnosis Date   Headache    Hypertension    Trigeminal neuralgia     Family History  Problem Relation Age of Onset   Atrial fibrillation Mother    Hypertension Mother    Arthritis Mother    Kidney disease Mother    Heart attack Father    Bone cancer Other    Liver disease Sister    Bipolar disorder Sister     Past Surgical History:  Procedure Laterality Date   VAGINAL HYSTERECTOMY     Social History   Occupational History    Comment: Surveyor, quantity  Tobacco Use   Smoking status: Never   Smokeless tobacco: Never  Vaping Use   Vaping Use: Never used  Substance and Sexual Activity   Alcohol use: No   Drug use: No   Sexual activity: Never

## 2021-02-16 ENCOUNTER — Encounter: Payer: Self-pay | Admitting: Orthopedic Surgery

## 2021-02-16 ENCOUNTER — Ambulatory Visit: Payer: PPO | Admitting: Orthopedic Surgery

## 2021-02-16 ENCOUNTER — Other Ambulatory Visit: Payer: Self-pay

## 2021-02-16 DIAGNOSIS — M25571 Pain in right ankle and joints of right foot: Secondary | ICD-10-CM

## 2021-02-16 NOTE — Progress Notes (Signed)
Office Visit Note   Patient: Kathleen [name]           Date of Birth: 10-23-1955           MRN: [id number] Visit Date: 02/16/2021 Requested by: Maurice Small, MD [address] [place],  [place] [address] PCP: Maurice Small, MD  Subjective: Chief Complaint  Patient presents with   Right Ankle - Follow-up    HPI: Kathleen [name] is a 66 year old patient with right ankle injury before Christmas.  She tried a Dosepak and ibuprofen.  Had really minimal relief with those interventions as well as a brace.  Reports a lot of pain and stiffness in the ankle in the morning.  She states that the right ankle started hurting before Christmas but cannot really recollect any specific injury but does report pain beginning around that time along with medial sided swelling.  Hurts her to weight-bear first thing in the morning.  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  She also reports a little nodule under her heel.              ROS: All systems reviewed are negative as they relate to the chief complaint within the history of present illness.  Patient denies  fevers or chills.   Assessment & Plan: Visit Diagnoses:  1. 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  She does have some forefoot collapse with standing.  She states she has always had flat feet.  Plan is Voltaren cream and hold off on the brace.  I think she would do better with better shoes that have better arches.  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  Follow-up after that study.  Follow-Up Instructions: No follow-ups on file.   Orders:  Orders Placed This Encounter  Procedures   MR Ankle Right w/o contrast   No orders of the defined types were placed in this encounter.     Procedures: No procedures  performed   Clinical Data: No additional findings.  Objective: Vital Signs: There were no vitals taken for this visit.  Physical Exam:   Constitutional: Patient appears well-developed HEENT:  Head: Normocephalic Eyes:EOM are normal Neck: Normal range of motion Cardiovascular: Normal rate Pulmonary/chest: Effort normal Neurologic: Patient is alert Skin: Skin is warm Psychiatric: Patient has normal mood and affect   Ortho Exam: Ortho exam demonstrates slightly antalgic gait to the right.  Pedal pulses palpable.  Patient has palpable intact nontender anterior to posterior to peroneal and Achilles tendons.  Mild swelling and slight tenderness over the posterior tib tendon behind the malleolus.  She is able to stand on her toes but has less heel inversion on the right compared to the left.  Tibiotalar subtalar transverse tarsal range of motion intact and symmetric between ankles.  There is some forefoot collapse more on the right than the left when standing.  She does have a 1 x 1 cm nodule on the lateral aspect of the plantar fascia region near the heel.  Nontender and freely mobile  Specialty Comments:  No specialty comments available.  Imaging: No results found.   PMFS History: Patient Active Problem List   Diagnosis Date Noted   Migraine with aura 04/24/2013   Trigeminal neuralgia 04/09/2012   Past Medical History:  Diagnosis Date   Headache    Hypertension    Trigeminal neuralgia     Family History  Problem Relation Age of Onset   Atrial fibrillation Mother    Hypertension Mother    Arthritis Mother    Kidney disease Mother    Heart attack Father    Bone cancer Other    Liver disease Sister    Bipolar disorder Sister     Past Surgical History:  Procedure Laterality Date   VAGINAL HYSTERECTOMY     Social History   Occupational History    Comment: Surveyor, quantity  Tobacco Use   Smoking status: Never   Smokeless tobacco: Never  Vaping Use   Vaping Use:  Never used  Substance and Sexual Activity   Alcohol use: No   Drug use: No   Sexual activity: Never

## 2021-03-21 DIAGNOSIS — G500 Trigeminal neuralgia: Secondary | ICD-10-CM | POA: Diagnosis not present

## 2021-03-21 DIAGNOSIS — Z8349 Family history of other endocrine, nutritional and metabolic diseases: Secondary | ICD-10-CM | POA: Diagnosis not present

## 2021-03-21 DIAGNOSIS — M8588 Other specified disorders of bone density and structure, other site: Secondary | ICD-10-CM | POA: Diagnosis not present

## 2021-03-21 DIAGNOSIS — R519 Headache, unspecified: Secondary | ICD-10-CM | POA: Diagnosis not present

## 2021-03-21 DIAGNOSIS — Z0000 Encounter for general adult medical examination without abnormal findings: Secondary | ICD-10-CM | POA: Diagnosis not present

## 2021-03-21 DIAGNOSIS — M1990 Unspecified osteoarthritis, unspecified site: Secondary | ICD-10-CM | POA: Diagnosis not present

## 2021-03-21 DIAGNOSIS — K219 Gastro-esophageal reflux disease without esophagitis: Secondary | ICD-10-CM | POA: Diagnosis not present

## 2021-03-21 DIAGNOSIS — E559 Vitamin D deficiency, unspecified: Secondary | ICD-10-CM | POA: Diagnosis not present

## 2021-03-21 DIAGNOSIS — I10 Essential (primary) hypertension: Secondary | ICD-10-CM | POA: Diagnosis not present

## 2021-03-21 DIAGNOSIS — K2940 Chronic atrophic gastritis without bleeding: Secondary | ICD-10-CM | POA: Diagnosis not present

## 2021-03-21 DIAGNOSIS — E7800 Pure hypercholesterolemia, unspecified: Secondary | ICD-10-CM | POA: Diagnosis not present

## 2021-04-20 DIAGNOSIS — Z23 Encounter for immunization: Secondary | ICD-10-CM | POA: Diagnosis not present

## 2021-05-09 ENCOUNTER — Ambulatory Visit: Payer: PPO | Admitting: Family Medicine

## 2021-08-09 DIAGNOSIS — M5410 Radiculopathy, site unspecified: Secondary | ICD-10-CM | POA: Diagnosis not present

## 2021-08-09 DIAGNOSIS — M546 Pain in thoracic spine: Secondary | ICD-10-CM | POA: Diagnosis not present

## 2021-09-28 DIAGNOSIS — K219 Gastro-esophageal reflux disease without esophagitis: Secondary | ICD-10-CM | POA: Diagnosis not present

## 2021-09-28 DIAGNOSIS — Z23 Encounter for immunization: Secondary | ICD-10-CM | POA: Diagnosis not present

## 2021-09-28 DIAGNOSIS — E7800 Pure hypercholesterolemia, unspecified: Secondary | ICD-10-CM | POA: Diagnosis not present

## 2021-09-28 DIAGNOSIS — G500 Trigeminal neuralgia: Secondary | ICD-10-CM | POA: Diagnosis not present

## 2021-09-28 DIAGNOSIS — I83813 Varicose veins of bilateral lower extremities with pain: Secondary | ICD-10-CM | POA: Diagnosis not present

## 2021-09-28 DIAGNOSIS — E559 Vitamin D deficiency, unspecified: Secondary | ICD-10-CM | POA: Diagnosis not present

## 2021-09-28 DIAGNOSIS — M170 Bilateral primary osteoarthritis of knee: Secondary | ICD-10-CM | POA: Diagnosis not present

## 2021-09-28 DIAGNOSIS — I10 Essential (primary) hypertension: Secondary | ICD-10-CM | POA: Diagnosis not present

## 2021-09-28 DIAGNOSIS — R519 Headache, unspecified: Secondary | ICD-10-CM | POA: Diagnosis not present

## 2021-10-25 ENCOUNTER — Other Ambulatory Visit: Payer: Self-pay | Admitting: *Deleted

## 2021-10-25 DIAGNOSIS — I8393 Asymptomatic varicose veins of bilateral lower extremities: Secondary | ICD-10-CM

## 2021-11-02 NOTE — Progress Notes (Deleted)
No chief complaint on file.   HISTORY OF PRESENT ILLNESS:  11/02/21 ALL:  Kathleen [name] returns for follow up for TN and migraines.   12/27/2020 ALL: Kathleen [name] returns for follow up for TN and migraines. She continues carbamazepine 200mg  BID. Emgality was started 06/2020 for migraine prevention. She called 08/2020 with concerns of burning with injeciton but otherwise tolerating well. Since, TN pain is well managed. She reports that migraines may be a little better on Emgality but she continues to have daily tension type headaches. Most every day she has some sort of headache. Most start after getting moving during the day. Some do wake her from sleep. She doesn't think she snores. She is taking Excedrin most every day that helps. She continues feel stress is contributing. She is living with her mother who is 17.   07/05/2020 ALL:  Kathleen [name] is a 66 y.o. female here today for follow up for trigeminal neuralgia and migraines. She continues carbamazepine 200mg  BID. TN pain is well managed. Very rare pain. She is having more headaches, recently. She has near daily headaches. Ajovy started 06/2019 for migraines but she never picked up injections. Sumatriptan continued for abortive therapy. It usually works well but she has had to use all 8 tablets each month. She takes Tylenol Tension headache at least 20 days a month. Nurtec was too expensive. She has a dull achy pain in the frontal/retro orbital region. She is has nausea. She has visual aura about once a month. Rest helps. 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 Amitriptyline helped her sleep but did not help headaches. She has annual physical with PCP. Labs have reportedly been normal. She did change BP meds in the past year. She is taking metoprolol 50mg  daily.   HISTORY (copied from Dr 07/2019 previous note)  UPDATE (06/30/19, VRP): Since last visit, doing well. TN sxs are better. HA are worse (daily  migraine and tension HA). Amitriptyline not helping. Off nadolol now; planning to start metoprolol.    UPDATE (07/09/18, VRP):  - since last visit, TN symptoms are improved. Now on CBZ 200mg  twice a day. - continues with HA, with more stress in her life; has been off amitriptyline due to sedation; now on more Excedrin almost daily   UPDATE (05/14/17, VRP): Since last visit, doing well. Tolerating meds. No alleviating or aggravating factors. HA are stable. TN pain is mild, but still there, esp with brushing, touching or eating.   UPDATE 05/08/16 (VRP): Since last visit, doing well. MRI brain was unremarkable. Right lower facial numbness improved. Now on CBZ 400mg  twice a day. Had 2 flare ups of pain (each lasting 1 week), and now better. Still with intermittent HA. Using OTC meds and rx meds. Still with some stress issues.   UPDATE 11/03/15 (VRP): Since last visit sxs increased 3 weeks ago. Pt incr CBZ and sxs improved. Also with intermittent right lower facial numbness in last few months.   UPDATE 05/06/15 (VRP): Since last visit, overall stable. Some pulling right ear sensation. No severe right TN pain. HA are stable. Tolerating CBZ 200mg  BID + amitriptyline 25mg  qhs.   UPDATE 10/22/14 (VRP): Since last visit, no TN pain. Still with 4 headaches per week. Amitriptyline is helping.   UPDATE 04/16/14 (VRP): Since last visit, continues to have 3-4 tension HA per week, 0-1 migraine per month. Right trigeminal neuralgia pain is resolved, stable on CBZ 400mg  BID. Some ongoing stress. No  other triggers. Left knee and hip pain limits physical activity.   UPDATE 10/09/13 (LL): Since last visit, she weaned off Topamax.  She does not notice any change in the frequency of headaches. States she continues to have 4 out of 7 days with a tension type headache.  She usually takes Excedrin on these days. Reduced her CBZ to 400 mg twice daily. Having less frequent severe Migraines, maybe 1-2 per month, in which she takes  Imitrex for relief.  Sometimes takes 2 doses to feel better.   UPDATE 07/10/13 (LL): Since last visit, no facial pain or vibration. She thinks Topamax makes her more tired. Having just as many headaches, still 3-4 days per week. Sumatriptan helpful, takes Excedrin for milder headaches. Would like to wean off some medication if possible.   UPDATE 04/24/13 (LL): Since last visit, trigeminal neuralgia is stable on CBZ, but when dose is wearing off she feels "vibration" sensation sometimes on right side of mouth. Migraines are worse, averaging 3-4 days per week. Sometimes wakes up with one in morning, sometimes comes on later in day. Most often bi-temporal or behind eyes. Sometimes in occipital region. At worst with nausea and vomiting. Positive for hyperosmia. Migraines last 4-6 hours. Sometimes relived with Excedrin. Imitrex will stop severe headache in usually an hour.   UPDATE 10/23/12: Since last visit, doing about the same. Baclofen 10mg  BID did not help that much more than carbamazepine 600mg  BID alone. Migraines worse for a while, but better with starting HCTZ and reducing Excedrin use.   UPDATE 04/09/12: Since last visit, doing about the same. Has not tried baclofen yet. Taking carbamazepine 600 mg twice a day. Still having some mild twinges of numbness and tingling on the right upper lip. No severe painful attacks.   UPDATE 09/04/11: Doing better. Tolerating CBZ 600mg  BID. Uses baclofen prn. No side effects. Mild twinges of aches, but no severe pain.   UPDATE 05/01/11: Doing well. Better on CBZ. Mild cognitive side effects. Fell 4 weeks ago from 09/06/11, broke left foot, but doesn't think it was medication related.   PRIOR HPI: 66 year old right-handed female with history of hypertension, migraine headache, here for evaluation of right facial pain. Patient has had intermittent episodes of electrical and lightening, shooting pain in her right jaw for over 10 years. Previous episodes lasted for only a few  weeks at a time. She describes brief, electrical, radiating flashes of pain in her right jaw, up towards her right eye, and deeper in her neck and inside her ear. She is been evaluated by her dentist numerous times over the years without a specific dental pathology to explain these symptoms. She does have a history of dental procedures and crowns on her right side. Since 02/04/2011, patient's symptoms have been more severe and persistent. Has tried hydrocodone, gabapentin, without relief. She denies any numbness, weakness, slurred speech, vision problems. She has a first cousin who was diagnosed with multiple sclerosis and is deceased now.   REVIEW OF SYSTEMS: Out of a complete 14 system review of symptoms, the patient complains only of the following symptoms, headaches, facial pain and all other reviewed systems are negative.   ALLERGIES: Allergies  Allergen Reactions   Morphine And Related Itching   Penicillins Diarrhea     HOME MEDICATIONS: Outpatient Medications Prior to Visit  Medication Sig Dispense Refill   Acetaminophen-Caffeine (EXCEDRIN TENSION HEADACHE PO) Take by mouth.     Ascorbic Acid (VITAMIN C) 1000 MG tablet 1 tablet  calcium carbonate (OS-CAL) 600 MG TABS Take 600 mg by mouth 2 (two) times daily with a meal.     carbamazepine (TEGRETOL XR) 200 MG 12 hr tablet Take 1 tablet (200 mg total) by mouth 2 (two) times daily. 180 tablet 3   cholecalciferol (VITAMIN D) 400 units TABS tablet Take 400 Units by mouth.     famotidine (PEPCID) 20 MG tablet Take 20 mg by mouth daily.     Galcanezumab-gnlm (EMGALITY) 120 MG/ML SOAJ Inject 120 mg into the skin every 30 (thirty) days. 3 mL 3   imipramine (TOFRANIL) 25 MG tablet Take 1 tablet (25 mg total) by mouth at bedtime. 90 tablet 3   metoprolol succinate (TOPROL-XL) 50 MG 24 hr tablet Take 50 mg by mouth daily.     Omega-3 Fatty Acids (FISH OIL) 1200 MG CAPS Take by mouth.     predniSONE (STERAPRED UNI-PAK 21 TAB) 5 MG (21) TBPK  tablet Take dosepak as directed 21 tablet 0   SUMAtriptan (IMITREX) 100 MG tablet Take 1 tablet (100 mg total) by mouth once as needed for migraine. May repeat x 1 after 2 hours; maximum 2 tabs per day and 8 tabs per month 8 tablet 12   No facility-administered medications prior to visit.     PAST MEDICAL HISTORY: Past Medical History:  Diagnosis Date   Headache    Hypertension    Trigeminal neuralgia      PAST SURGICAL HISTORY: Past Surgical History:  Procedure Laterality Date   VAGINAL HYSTERECTOMY       FAMILY HISTORY: Family History  Problem Relation Age of Onset   Atrial fibrillation Mother    Hypertension Mother    Arthritis Mother    Kidney disease Mother    Heart attack Father    Bone cancer Other    Liver disease Sister    Bipolar disorder Sister      SOCIAL HISTORY: Social History   Socioeconomic History   Marital status: Married    Spouse name: Keenan Bachelor   Number of children: 1   Years of education: HS   Highest education level: Not on file  Occupational History    Comment: Licensed conveyancer  Tobacco Use   Smoking status: Never   Smokeless tobacco: Never  Vaping Use   Vaping Use: Never used  Substance and Sexual Activity   Alcohol use: No   Drug use: No   Sexual activity: Never  Other Topics Concern   Not on file  Social History Narrative   Caffeine Use- She drinks coffee and sodas on occasion.   05/08/16, 07/08/18 Patient lives at home with spouse. Her mother lives there.    Right handed   Social Determinants of Health   Financial Resource Strain: Not on file  Food Insecurity: Not on file  Transportation Needs: Not on file  Physical Activity: Not on file  Stress: Not on file  Social Connections: Not on file  Intimate Partner Violence: Not on file     PHYSICAL EXAM  There were no vitals filed for this visit.   There is no height or weight on file to calculate BMI.   Generalized: Well developed, in no acute distress  Cardiology:  normal rate and rhythm, no murmur auscultated  Respiratory: clear to auscultation bilaterally    Neurological examination  Mentation: Alert oriented to time, place, history taking. Follows all commands speech and language fluent Cranial nerve II-XII: Pupils were equal round reactive to light. Extraocular movements were full, visual field  were full on confrontational test. Facial sensation and strength were normal. Uvula tongue midline. Head turning and shoulder shrug  were normal and symmetric. Motor: The motor testing reveals 5 over 5 strength of all 4 extremities. Good symmetric motor tone is noted throughout.  Sensory: Sensory testing is intact to soft touch on all 4 extremities. No evidence of extinction is noted.  Coordination: Cerebellar testing reveals good finger-nose-finger and heel-to-shin bilaterally.  Gait and station: Gait is normal. Tandem gait is normal. Romberg is negative. No drift is seen.  Reflexes: Deep tendon reflexes are symmetric and normal bilaterally.    DIAGNOSTIC DATA (LABS, IMAGING, TESTING) - I reviewed patient records, labs, notes, testing and imaging myself where available.  No results found for: "WBC", "HGB", "HCT", "MCV", "PLT" No results found for: "NA", "K", "CL", "CO2", "GLUCOSE", "BUN", "CREATININE", "CALCIUM", "PROT", "ALBUMIN", "AST", "ALT", "ALKPHOS", "BILITOT", "GFRNONAA", "GFRAA" No results found for: "CHOL", "HDL", "LDLCALC", "LDLDIRECT", "TRIG", "CHOLHDL" No results found for: "HGBA1C" No results found for: "VITAMINB12" No results found for: "TSH"      No data to display               No data to display           ASSESSMENT AND PLAN  66 y.o. year old female  has a past medical history of Headache, Hypertension, and Trigeminal neuralgia. here with    No diagnosis found.  Kierstan reports that trigeminal pain is well managed on carbamazepine. We will continue 200mg  twice daily. She will continue labs with PCP.  We will continue  Emgality for now. I am going to add imipramine 25mg  daily at bedtime. She will continue sumatriptan and Excedrin as needed but advised against regular use of abortive medicaitons. We have discussed considering a sleep eval. No other signs of sleep apnea outside of morning headaches. She will think about this. Healthy lifestyle habits reviewed. She will follow up in 6 months.   No orders of the defined types were placed in this encounter.     No orders of the defined types were placed in this encounter.    , MSN, FNP-C 11/02/2021, 4:29 PM  [facility] [address] [place], [address] [place] [phone]

## 2021-11-02 NOTE — Patient Instructions (Incomplete)

## 2021-11-04 ENCOUNTER — Encounter (HOSPITAL_COMMUNITY): Payer: PPO

## 2021-11-09 ENCOUNTER — Ambulatory Visit: Payer: PPO | Admitting: Family Medicine

## 2021-11-09 DIAGNOSIS — G500 Trigeminal neuralgia: Secondary | ICD-10-CM

## 2021-11-09 DIAGNOSIS — G43109 Migraine with aura, not intractable, without status migrainosus: Secondary | ICD-10-CM

## 2021-12-22 NOTE — Progress Notes (Signed)
[facility]           OF [place]  History and Physical   Kathleen [name] is a 66 y.o. female who presents with bilateral lower extremity varicosities.  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  She states she has large varicosities on the front of her leg that cross over her knee and compression socks over this is uncomfortable with the knee high.  She states that she has worn them but not really noticed much difference with wearing them.  She does have swelling at times.   She is now having some pain around the medial right ankle.  She does not have hx of DVT.  She does not have skin color changes.  She has never had any bleeding from her superficial veins.  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  She does try to stay on the go.   She states that her father had bad varicose veins.  He died at age 7 from what was told to be a massive heart attack, but no autopsy was done and she feels it may have been from an aneurysm.  Her grandmother died from a AAA.    She has hx of HTN, trigeminal neuralgia and migraines.   The pt is not on a statin for cholesterol management.  The pt is not on a daily aspirin.   Other AC:  none The pt is on BB for hypertension.   The pt is not diabetic.   Tobacco hx:  never  Pt does  have family hx of AAA.  See above.   Past Medical History:  Diagnosis Date   Headache    Hypertension    Trigeminal neuralgia     Past Surgical History:  Procedure Laterality Date   VAGINAL HYSTERECTOMY      Social History   Socioeconomic History   Marital status: Married    Spouse name: Keenan Bachelor   Number of children: 1   Years of education: HS   Highest education level: Not on file  Occupational History    Comment: Licensed conveyancer  Tobacco Use   Smoking status: Never   Smokeless tobacco: Never  Vaping Use   Vaping Use: Never used  Substance and Sexual Activity   Alcohol use: No   Drug use: No    Sexual activity: Never  Other Topics Concern   Not on file  Social History Narrative   Caffeine Use- She drinks coffee and sodas on occasion.   05/08/16, 07/08/18 Patient lives at home with spouse. Her mother lives there.    Right handed   Social Determinants of Health   Financial Resource Strain: Not on file  Food Insecurity: Not on file  Transportation Needs: Not on file  Physical Activity: Not on file  Stress: Not on file  Social Connections: Not on file  Intimate Partner Violence: Not on file    Family History  Problem Relation Age of Onset   Atrial fibrillation Mother    Hypertension Mother    Arthritis Mother    Kidney disease Mother    Heart attack Father    Bone cancer Other    Liver disease Sister    Bipolar disorder Sister     Current Outpatient Medications  Medication Sig Dispense Refill   Acetaminophen-Caffeine (EXCEDRIN TENSION HEADACHE PO) Take by mouth.     Ascorbic Acid (VITAMIN C)  1000 MG tablet 1 tablet     calcium carbonate (OS-CAL) 600 MG TABS Take 600 mg by mouth 2 (two) times daily with a meal.     carbamazepine (TEGRETOL XR) 200 MG 12 hr tablet Take 1 tablet (200 mg total) by mouth 2 (two) times daily. 180 tablet 3   cholecalciferol (VITAMIN D) 400 units TABS tablet Take 400 Units by mouth.     famotidine (PEPCID) 20 MG tablet Take 20 mg by mouth daily.     Galcanezumab-gnlm (EMGALITY) 120 MG/ML SOAJ Inject 120 mg into the skin every 30 (thirty) days. 3 mL 3   imipramine (TOFRANIL) 25 MG tablet Take 1 tablet (25 mg total) by mouth at bedtime. 90 tablet 3   metoprolol succinate (TOPROL-XL) 50 MG 24 hr tablet Take 50 mg by mouth daily.     Omega-3 Fatty Acids (FISH OIL) 1200 MG CAPS Take by mouth.     predniSONE (STERAPRED UNI-PAK 21 TAB) 5 MG (21) TBPK tablet Take dosepak as directed 21 tablet 0   SUMAtriptan (IMITREX) 100 MG tablet Take 1 tablet (100 mg total) by mouth once as needed for migraine. May repeat x 1 after 2 hours; maximum 2 tabs per day  and 8 tabs per month 8 tablet 12   No current facility-administered medications for this visit.    Allergies  Allergen Reactions   Morphine And Related Itching   Penicillins Diarrhea    REVIEW OF SYSTEMS:   [X]  denotes positive finding, [ ]  denotes negative finding Cardiac  Comments:  Chest pain or chest pressure:    Shortness of breath upon exertion: x   Short of breath when lying flat:    Irregular heart rhythm:        Vascular    Pain in calf, thigh, or hip brought on by ambulation:    Pain in feet at night that wakes you up from your sleep:     Blood clot in your veins:    Leg swelling:  x       Pulmonary    Oxygen at home:    Productive cough:     Wheezing:         Neurologic    Sudden weakness in arms or legs:     Sudden numbness in arms or legs:     Sudden onset of difficulty speaking or slurred speech:    Temporary loss of vision in one eye:     Problems with dizziness:         Gastrointestinal    Blood in stool:     Vomited blood:         Genitourinary    Burning when urinating:     Blood in urine:        Psychiatric    Major depression:         Hematologic    Bleeding problems:    Problems with blood clotting too easily:        Skin    Rashes or ulcers:        Constitutional    Fever or chills:      PHYSICAL EXAMINATION:  Today's Vitals   12/23/21 1246  BP: (!) 155/99  Pulse: 84  Resp: 20  Temp: 98.6 F (37 C)  TempSrc: Temporal  SpO2: 97%  Weight: 165 lb (74.8 kg)  Height: 5\' 3"  (1.6 m)   Body mass index is 29.23 kg/m.   General:  WDWN in NAD; vital signs documented above Gait:  Not observed HENT: WNL, normocephalic Pulmonary: normal non-labored breathing without wheezing Cardiac: regular HR; without carotid bruits Abdomen: soft, NT, aortic pulse is not palpable Skin: without rashes Vascular Exam/Pulses:  Right Left  Radial 2+ (normal) 2+ (normal)  DP 2+ (normal) 2+ (normal)   Extremities: large varicosities right leg  with spider veins present.  Neurologic: A&O X 3;  moving all extremities equally Psychiatric:  The pt has Normal affect.   Non-Invasive Vascular Imaging:   Venous duplex on 12/23/2021: Venous Reflux Times  +--------------+---------+------+-----------+------------+--------+  RIGHT        Reflux NoRefluxReflux TimeDiameter cmsComments                          Yes                                   +--------------+---------+------+-----------+------------+--------+  CFV                    yes   >1 second                       +--------------+---------+------+-----------+------------+--------+  FV mid        no                                              +--------------+---------+------+-----------+------------+--------+  Popliteal    no                                              +--------------+---------+------+-----------+------------+--------+  GSV at SFJ                     >500 ms      0.79              +--------------+---------+------+-----------+------------+--------+  GSV prox thighno                            0.36              +--------------+---------+------+-----------+------------+--------+  GSV mid thigh           yes    >500 ms      0.34              +--------------+---------+------+-----------+------------+--------+  GSV dist thighno                            0.22              +--------------+---------+------+-----------+------------+--------+  GSV at knee   no                            0.29              +--------------+---------+------+-----------+------------+--------+  GSV prox calf no                            0.33              +--------------+---------+------+-----------+------------+--------+  GSV mid calf  no                            0.25              +--------------+---------+------+-----------+------------+--------+  SSV Pop Fossa           yes    >500 ms      0.59               +--------------+---------+------+-----------+------------+--------+  SSV prox calf           yes    >500 ms      0.62              +--------------+---------+------+-----------+------------+--------+  SSV mid calf  no        yes    >500 ms      0.30              +--------------+---------+------+-----------+------------+--------+  AASV o        no                            0.26              +--------------+---------+------+-----------+------------+--------+  AASV p        no                            0.25              +--------------+---------+------+-----------+------------+--------+   Summary:  Right:  - No evidence of deep vein thrombosis seen in the right lower extremity, from the common femoral through the popliteal veins.  - Venous reflux is noted in the right common femoral vein.  - Venous reflux is noted in the right sapheno-femoral junction.  - Venous reflux is noted in the right greater saphenous vein in the thigh.  - Venous reflux is noted in the right short saphenous vein.  - No evidence of reflux in the AASV     AIRLIE CARRITHERS is a 66 y.o. female who presents with: bilateral lower extremity varicosities with right worse than left    -pt has easily palpable DP pedal pulses bilaterally -pt does not have evidence of DVT.  Pt does have venous reflux in the deep CFV as well as the GSV at the [facility] and in the GSV mid thigh and SSV in the popliteal fossa and proximal calf.  The SSV in the popliteal fossa and proximal calf measure 0.6cm.   -discussed with pt about wearing thigh high 20-30 mmHg compression stockings and pt was measured for these today.    -discussed with pt that she may or may not benefit from laser ablation of her SSV.  She is willing to wear compression.  She will come back in 3 months for further discussion with MD.   If she has more issues with the left leg, we can get venous duplex of LLE in the future.   -discussed the  importance of leg elevation and how to elevate properly - pt is advised to elevate their legs and a diagram is given to them to demonstrate for pt to lay flat on their back with knees elevated and slightly bent with their feet higher than their knees, which puts their feet higher than their heart for 15 minutes per day.  If pt cannot lay flat, advised to lay as flat as possible.  -  pt is advised to continue as much walking as possible and avoid sitting or standing for long periods of time.  -discussed importance of  exercise and that water aerobics would also be beneficial.  -handout with recommendations given  Family Hx of AAA -her grandmother died with AAA and her father died at age 52 and she is unsure if this was from AAA.  Given this hx, when she returns for vein discussion, we will get Medicare screen at that time.  Discussed that if she develops sudden, severe abdominal or back pain to call 911.  Her aorta is not palpable on exam.    Doreatha Massed, [facility] [phone]  Clinic MD:  Karin Lieu

## 2021-12-23 ENCOUNTER — Ambulatory Visit (HOSPITAL_COMMUNITY)
Admission: RE | Admit: 2021-12-23 | Discharge: 2021-12-23 | Disposition: A | Discharge status: Home / Self Care | Payer: PPO | Source: Ambulatory Visit | Attending: Vascular Surgery | Admitting: Vascular Surgery

## 2021-12-23 ENCOUNTER — Ambulatory Visit: Payer: PPO | Admitting: Physician Assistant

## 2021-12-23 VITALS — BP 155/99 | HR 84 | Temp 98.6°F | Resp 20 | Ht 63.0 in | Wt 165.0 lb

## 2021-12-23 DIAGNOSIS — I8393 Asymptomatic varicose veins of bilateral lower extremities: Secondary | ICD-10-CM

## 2022-01-03 ENCOUNTER — Other Ambulatory Visit: Payer: Self-pay

## 2022-01-03 DIAGNOSIS — Z8249 Family history of ischemic heart disease and other diseases of the circulatory system: Secondary | ICD-10-CM

## 2022-01-18 ENCOUNTER — Other Ambulatory Visit: Payer: Self-pay

## 2022-01-18 DIAGNOSIS — G500 Trigeminal neuralgia: Secondary | ICD-10-CM

## 2022-01-18 MED ORDER — CARBAMAZEPINE ER 200 MG PO TB12: 200.0000 mg | ORAL_TABLET | Freq: Two times a day (BID) | ORAL | 3 refills | Status: DC

## 2022-03-23 DIAGNOSIS — I83813 Varicose veins of bilateral lower extremities with pain: Secondary | ICD-10-CM | POA: Diagnosis not present

## 2022-03-23 DIAGNOSIS — I10 Essential (primary) hypertension: Secondary | ICD-10-CM | POA: Diagnosis not present

## 2022-03-23 DIAGNOSIS — E559 Vitamin D deficiency, unspecified: Secondary | ICD-10-CM | POA: Diagnosis not present

## 2022-03-23 DIAGNOSIS — Z0000 Encounter for general adult medical examination without abnormal findings: Secondary | ICD-10-CM | POA: Diagnosis not present

## 2022-03-23 DIAGNOSIS — G500 Trigeminal neuralgia: Secondary | ICD-10-CM | POA: Diagnosis not present

## 2022-03-23 DIAGNOSIS — F418 Other specified anxiety disorders: Secondary | ICD-10-CM | POA: Diagnosis not present

## 2022-03-23 DIAGNOSIS — E669 Obesity, unspecified: Secondary | ICD-10-CM | POA: Diagnosis not present

## 2022-03-23 DIAGNOSIS — K219 Gastro-esophageal reflux disease without esophagitis: Secondary | ICD-10-CM | POA: Diagnosis not present

## 2022-03-23 DIAGNOSIS — R635 Abnormal weight gain: Secondary | ICD-10-CM | POA: Diagnosis not present

## 2022-03-23 DIAGNOSIS — M8588 Other specified disorders of bone density and structure, other site: Secondary | ICD-10-CM | POA: Diagnosis not present

## 2022-03-23 DIAGNOSIS — R519 Headache, unspecified: Secondary | ICD-10-CM | POA: Diagnosis not present

## 2022-03-23 DIAGNOSIS — E7800 Pure hypercholesterolemia, unspecified: Secondary | ICD-10-CM | POA: Diagnosis not present

## 2022-03-23 DIAGNOSIS — Z8349 Family history of other endocrine, nutritional and metabolic diseases: Secondary | ICD-10-CM | POA: Diagnosis not present

## 2022-03-24 ENCOUNTER — Other Ambulatory Visit: Payer: Self-pay | Admitting: Internal Medicine

## 2022-03-24 DIAGNOSIS — Z1231 Encounter for screening mammogram for malignant neoplasm of breast: Secondary | ICD-10-CM

## 2022-03-27 ENCOUNTER — Ambulatory Visit (HOSPITAL_COMMUNITY): Payer: PPO

## 2022-03-27 ENCOUNTER — Ambulatory Visit: Payer: PPO | Admitting: Surgery

## 2022-03-28 ENCOUNTER — Other Ambulatory Visit: Payer: Self-pay | Admitting: Internal Medicine

## 2022-03-28 ENCOUNTER — Ambulatory Visit
Admission: RE | Admit: 2022-03-28 | Discharge: 2022-03-28 | Disposition: A | Discharge status: Home / Self Care | Payer: PPO | Source: Ambulatory Visit | Attending: Internal Medicine | Admitting: Internal Medicine

## 2022-03-28 DIAGNOSIS — Z1231 Encounter for screening mammogram for malignant neoplasm of breast: Secondary | ICD-10-CM

## 2022-03-28 DIAGNOSIS — M8588 Other specified disorders of bone density and structure, other site: Secondary | ICD-10-CM

## 2022-05-08 NOTE — Progress Notes (Unsigned)
No chief complaint on file.   HISTORY OF PRESENT ILLNESS:  05/08/22 ALL:  Kathleen [name] returns for follow up for TN and migraines. She was last seen 12/2020. She continues carbamzepine 200mg  BID and Emgality monthly???.    12/27/2020 ALL: Kathleen [name] returns for follow up for TN and migraines. She continues carbamazepine 200mg  BID. Emgality was started 06/2020 for migraine prevention. She called 08/2020 with concerns of burning with injeciton but otherwise tolerating well. Since, TN pain is well managed. She reports that migraines may be a little better on Emgality but she continues to have daily tension type headaches. Most every day she has some sort of headache. Most start after getting moving during the day. Some do wake her from sleep. She doesn't think she snores. She is taking Excedrin most every day that helps. She continues feel stress is contributing. She is living with her mother who is 53.   07/05/2020 ALL:  Kathleen [name] is a 67 y.o. female here today for follow up for trigeminal neuralgia and migraines. She continues carbamazepine 200mg  BID. TN pain is well managed. Very rare pain. She is having more headaches, recently. She has near daily headaches. Ajovy started 06/2019 for migraines but she never picked up injections. Sumatriptan continued for abortive therapy. It usually works well but she has had to use all 8 tablets each month. She takes Tylenol Tension headache at least 20 days a month. Nurtec was too expensive. She has a dull achy pain in the frontal/retro orbital region. She is has nausea. She has visual aura about once a month. Rest helps. 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 Amitriptyline helped her sleep but did not help headaches. She has annual physical with PCP. Labs have reportedly been normal. She did change BP meds in the past year. She is taking metoprolol 50mg  daily.   HISTORY (copied from Dr Richrd Humbles previous  note)  UPDATE (06/30/19, VRP): Since last visit, doing well. TN sxs are better. HA are worse (daily migraine and tension HA). Amitriptyline not helping. Off nadolol now; planning to start metoprolol.    UPDATE (07/09/18, VRP):  - since last visit, TN symptoms are improved. Now on CBZ 200mg  twice a day. - continues with HA, with more stress in her life; has been off amitriptyline due to sedation; now on more Excedrin almost daily   UPDATE (05/14/17, VRP): Since last visit, doing well. Tolerating meds. No alleviating or aggravating factors. HA are stable. TN pain is mild, but still there, esp with brushing, touching or eating.   UPDATE 05/08/16 (VRP): Since last visit, doing well. MRI brain was unremarkable. Right lower facial numbness improved. Now on CBZ 400mg  twice a day. Had 2 flare ups of pain (each lasting 1 week), and now better. Still with intermittent HA. Using OTC meds and rx meds. Still with some stress issues.   UPDATE 11/03/15 (VRP): Since last visit sxs increased 3 weeks ago. Pt incr CBZ and sxs improved. Also with intermittent right lower facial numbness in last few months.   UPDATE 05/06/15 (VRP): Since last visit, overall stable. Some pulling right ear sensation. No severe right TN pain. HA are stable. Tolerating CBZ 200mg  BID + amitriptyline 25mg  qhs.   UPDATE 10/22/14 (VRP): Since last visit, no TN pain. Still with 4 headaches per week. Amitriptyline is helping.   UPDATE 04/16/14 (VRP): Since last visit, continues to have 3-4 tension HA per week, 0-1 migraine per month. Right  trigeminal neuralgia pain is resolved, stable on CBZ 400mg  BID. Some ongoing stress. No other triggers. Left knee and hip pain limits physical activity.   UPDATE 10/09/13 (LL): Since last visit, she weaned off Topamax.  She does not notice any change in the frequency of headaches. States she continues to have 4 out of 7 days with a tension type headache.  She usually takes Excedrin on these days. Reduced her CBZ to  400 mg twice daily. Having less frequent severe Migraines, maybe 1-2 per month, in which she takes Imitrex for relief.  Sometimes takes 2 doses to feel better.   UPDATE 07/10/13 (LL): Since last visit, no facial pain or vibration. She thinks Topamax makes her more tired. Having just as many headaches, still 3-4 days per week. Sumatriptan helpful, takes Excedrin for milder headaches. Would like to wean off some medication if possible.   UPDATE 04/24/13 (LL): Since last visit, trigeminal neuralgia is stable on CBZ, but when dose is wearing off she feels "vibration" sensation sometimes on right side of mouth. Migraines are worse, averaging 3-4 days per week. Sometimes wakes up with one in morning, sometimes comes on later in day. Most often bi-temporal or behind eyes. Sometimes in occipital region. At worst with nausea and vomiting. Positive for hyperosmia. Migraines last 4-6 hours. Sometimes relived with Excedrin. Imitrex will stop severe headache in usually an hour.   UPDATE 10/23/12: Since last visit, doing about the same. Baclofen 10mg  BID did not help that much more than carbamazepine 600mg  BID alone. Migraines worse for a while, but better with starting HCTZ and reducing Excedrin use.   UPDATE 04/09/12: Since last visit, doing about the same. Has not tried baclofen yet. Taking carbamazepine 600 mg twice a day. Still having some mild twinges of numbness and tingling on the right upper lip. No severe painful attacks.   UPDATE 09/04/11: Doing better. Tolerating CBZ 600mg  BID. Uses baclofen prn. No side effects. Mild twinges of aches, but no severe pain.   UPDATE 05/01/11: Doing well. Better on CBZ. Mild cognitive side effects. Fell 4 weeks ago from [employer], broke left foot, but doesn't think it was medication related.   PRIOR HPI: 67 year old right-handed female with history of hypertension, migraine headache, here for evaluation of right facial pain. Patient has had intermittent episodes of electrical and  lightening, shooting pain in her right jaw for over 10 years. Previous episodes lasted for only a few weeks at a time. She describes brief, electrical, radiating flashes of pain in her right jaw, up towards her right eye, and deeper in her neck and inside her ear. She is been evaluated by her dentist numerous times over the years without a specific dental pathology to explain these symptoms. She does have a history of dental procedures and crowns on her right side. Since 02/04/2011, patient's symptoms have been more severe and persistent. Has tried hydrocodone, gabapentin, without relief. She denies any numbness, weakness, slurred speech, vision problems. She has a first cousin who was diagnosed with multiple sclerosis and is deceased now.   REVIEW OF SYSTEMS: Out of a complete 14 system review of symptoms, the patient complains only of the following symptoms, headaches, facial pain and all other reviewed systems are negative.   ALLERGIES: Allergies  Allergen Reactions   Morphine And Related Itching   Penicillins Diarrhea     HOME MEDICATIONS: Outpatient Medications Prior to Visit  Medication Sig Dispense Refill   Acetaminophen-Caffeine (EXCEDRIN TENSION HEADACHE PO) Take by mouth.  Ascorbic Acid (VITAMIN C) 1000 MG tablet 1 tablet     calcium carbonate (OS-CAL) 600 MG TABS Take 600 mg by mouth 2 (two) times daily with a meal.     carbamazepine (TEGRETOL XR) 200 MG 12 hr tablet Take 1 tablet (200 mg total) by mouth 2 (two) times daily. 180 tablet 3   cholecalciferol (VITAMIN D) 400 units TABS tablet Take 400 Units by mouth.     famotidine (PEPCID) 20 MG tablet Take 20 mg by mouth daily.     Galcanezumab-gnlm (EMGALITY) 120 MG/ML SOAJ Inject 120 mg into the skin every 30 (thirty) days. 3 mL 3   imipramine (TOFRANIL) 25 MG tablet Take 1 tablet (25 mg total) by mouth at bedtime. 90 tablet 3   metoprolol succinate (TOPROL-XL) 50 MG 24 hr tablet Take 50 mg by mouth daily.     Omega-3 Fatty  Acids (FISH OIL) 1200 MG CAPS Take by mouth.     predniSONE (STERAPRED UNI-PAK 21 TAB) 5 MG (21) TBPK tablet Take dosepak as directed 21 tablet 0   SUMAtriptan (IMITREX) 100 MG tablet Take 1 tablet (100 mg total) by mouth once as needed for migraine. May repeat x 1 after 2 hours; maximum 2 tabs per day and 8 tabs per month 8 tablet 12   No facility-administered medications prior to visit.     PAST MEDICAL HISTORY: Past Medical History:  Diagnosis Date   Headache    Hypertension    Trigeminal neuralgia      PAST SURGICAL HISTORY: Past Surgical History:  Procedure Laterality Date   VAGINAL HYSTERECTOMY       FAMILY HISTORY: Family History  Problem Relation Age of Onset   Atrial fibrillation Mother    Hypertension Mother    Arthritis Mother    Kidney disease Mother    Heart attack Father    Bone cancer Other    Liver disease Sister    Bipolar disorder Sister      SOCIAL HISTORY: Social History   Socioeconomic History   Marital status: Married    Spouse name: Kathleen [name]   Number of children: 1   Years of education: HS   Highest education level: Not on file  Occupational History    Comment: Surveyor, quantity  Tobacco Use   Smoking status: Never    Passive exposure: Never   Smokeless tobacco: Never  Vaping Use   Vaping Use: Never used  Substance and Sexual Activity   Alcohol use: No   Drug use: No   Sexual activity: Never  Other Topics Concern   Not on file  Social History Narrative   Caffeine Use- She drinks coffee and sodas on occasion.   05/08/16, 07/08/18 Patient lives at home with spouse. Her mother lives there.    Right handed   Social Determinants of Health   Financial Resource Strain: Not on file  Food Insecurity: Not on file  Transportation Needs: Not on file  Physical Activity: Not on file  Stress: Not on file  Social Connections: Not on file  Intimate Partner Violence: Not on file     PHYSICAL EXAM  There were no vitals filed for this  visit.   There is no height or weight on file to calculate BMI.   Generalized: Well developed, in no acute distress  Cardiology: normal rate and rhythm, no murmur auscultated  Respiratory: clear to auscultation bilaterally    Neurological examination  Mentation: Alert oriented to time, place, history taking. Follows all commands speech  and language fluent Cranial nerve II-XII: Pupils were equal round reactive to light. Extraocular movements were full, visual field were full on confrontational test. Facial sensation and strength were normal. Uvula tongue midline. Head turning and shoulder shrug  were normal and symmetric. Motor: The motor testing reveals 5 over 5 strength of all 4 extremities. Good symmetric motor tone is noted throughout.  Sensory: Sensory testing is intact to soft touch on all 4 extremities. No evidence of extinction is noted.  Coordination: Cerebellar testing reveals good finger-nose-finger and heel-to-shin bilaterally.  Gait and station: Gait is normal. Tandem gait is normal. Romberg is negative. No drift is seen.  Reflexes: Deep tendon reflexes are symmetric and normal bilaterally.    DIAGNOSTIC DATA (LABS, IMAGING, TESTING) - I reviewed patient records, labs, notes, testing and imaging myself where available.  No results found for: "WBC", "HGB", "HCT", "MCV", "PLT" No results found for: "NA", "K", "CL", "CO2", "GLUCOSE", "BUN", "CREATININE", "CALCIUM", "PROT", "ALBUMIN", "AST", "ALT", "ALKPHOS", "BILITOT", "GFRNONAA", "GFRAA" No results found for: "CHOL", "HDL", "LDLCALC", "LDLDIRECT", "TRIG", "CHOLHDL" No results found for: "HGBA1C" No results found for: "VITAMINB12" No results found for: "TSH"      No data to display               No data to display           ASSESSMENT AND PLAN  67 y.o. year old female  has a past medical history of Headache, Hypertension, and Trigeminal neuralgia. here with    No diagnosis found.  Jakiah reports that  trigeminal pain is well managed on carbamazepine. We will continue 200mg  twice daily. She will continue labs with PCP.  We will continue Emgality for now. I am going to add imipramine 25mg  daily at bedtime. She will continue sumatriptan and Excedrin as needed but advised against regular use of abortive medicaitons. We have discussed considering a sleep eval. No other signs of sleep apnea outside of morning headaches. She will think about this. Healthy lifestyle habits reviewed. She will follow up in 6 months.   No orders of the defined types were placed in this encounter.     No orders of the defined types were placed in this encounter.    Shawnie Dapper, MSN, FNP-C 05/08/2022, 4:53 PM  [facility] [address] [place], [place] [address] [phone]

## 2022-05-08 NOTE — Patient Instructions (Signed)
Below is our plan:  We will continue carbamazepine 200mg  twice daily. We will add imipramine 25mg  daily about 30 minutes prior to bedtime. If well tolerated, we can increase dose in about 8 weeks. Let me know if you have any unwanted side effects as discussed.   Wean Tylenol Tension: Week 1 take no more than 3 tablets daily      Week 2 take no more then 2 tablets daily       Week 3 take no more than 1 tablet daily      Week 4 try to take 1 tablet every other day  GOAL: may use Tylenol Tension (or other OTC analgesics about 1-2 times a week)  Keep an eye on your blood pressure. Read about sleep apnea. If any concerns we can send you for an evaluation. Consider reaching out to a counselor.   Please make sure you are staying well hydrated. I recommend 50-60 ounces daily. Well balanced diet and regular exercise encouraged. Consistent sleep schedule with 6-8 hours recommended.   Please continue follow up with care team as directed.   Follow up with me in 6 months   You may receive a survey regarding today's visit. I encourage you to leave honest feed back as I do use this information to improve patient care. Thank you for seeing me today!

## 2022-05-09 ENCOUNTER — Ambulatory Visit: Payer: PPO | Admitting: Family Medicine

## 2022-05-09 ENCOUNTER — Encounter: Payer: Self-pay | Admitting: Family Medicine

## 2022-05-09 VITALS — BP 157/89 | HR 105 | Ht 62.0 in | Wt 177.6 lb

## 2022-05-09 DIAGNOSIS — F489 Nonpsychotic mental disorder, unspecified: Secondary | ICD-10-CM | POA: Diagnosis not present

## 2022-05-09 DIAGNOSIS — G500 Trigeminal neuralgia: Secondary | ICD-10-CM

## 2022-05-09 DIAGNOSIS — G43109 Migraine with aura, not intractable, without status migrainosus: Secondary | ICD-10-CM | POA: Diagnosis not present

## 2022-05-09 MED ORDER — SUMATRIPTAN SUCCINATE 100 MG PO TABS: 100.0000 mg | ORAL_TABLET | Freq: Once | ORAL | 12 refills | Status: DC | PRN

## 2022-05-09 MED ORDER — IMIPRAMINE HCL 25 MG PO TABS: 25.0000 mg | ORAL_TABLET | Freq: Every day | ORAL | 3 refills | Status: DC

## 2022-05-10 LAB — SODIUM: Sodium: 139 mmol/L (ref 134–144)

## 2022-05-10 LAB — CARBAMAZEPINE LEVEL, TOTAL: Carbamazepine (Tegretol), S: 8.3 ug/mL (ref 4.0–12.0)

## 2022-06-16 DIAGNOSIS — K635 Polyp of colon: Secondary | ICD-10-CM | POA: Diagnosis not present

## 2022-06-16 DIAGNOSIS — K648 Other hemorrhoids: Secondary | ICD-10-CM | POA: Diagnosis not present

## 2022-06-16 DIAGNOSIS — K5730 Diverticulosis of large intestine without perforation or abscess without bleeding: Secondary | ICD-10-CM | POA: Diagnosis not present

## 2022-06-16 DIAGNOSIS — Z1211 Encounter for screening for malignant neoplasm of colon: Secondary | ICD-10-CM | POA: Diagnosis not present

## 2022-06-21 DIAGNOSIS — K635 Polyp of colon: Secondary | ICD-10-CM | POA: Diagnosis not present

## 2022-07-27 ENCOUNTER — Encounter: Payer: Self-pay | Admitting: Family Medicine

## 2022-09-21 DIAGNOSIS — R519 Headache, unspecified: Secondary | ICD-10-CM | POA: Diagnosis not present

## 2022-09-21 DIAGNOSIS — M19049 Primary osteoarthritis, unspecified hand: Secondary | ICD-10-CM | POA: Diagnosis not present

## 2022-09-21 DIAGNOSIS — E559 Vitamin D deficiency, unspecified: Secondary | ICD-10-CM | POA: Diagnosis not present

## 2022-09-21 DIAGNOSIS — M21611 Bunion of right foot: Secondary | ICD-10-CM | POA: Diagnosis not present

## 2022-09-21 DIAGNOSIS — E7800 Pure hypercholesterolemia, unspecified: Secondary | ICD-10-CM | POA: Diagnosis not present

## 2022-09-21 DIAGNOSIS — G500 Trigeminal neuralgia: Secondary | ICD-10-CM | POA: Diagnosis not present

## 2022-09-21 DIAGNOSIS — M21612 Bunion of left foot: Secondary | ICD-10-CM | POA: Diagnosis not present

## 2022-09-21 DIAGNOSIS — K219 Gastro-esophageal reflux disease without esophagitis: Secondary | ICD-10-CM | POA: Diagnosis not present

## 2022-09-21 DIAGNOSIS — R2242 Localized swelling, mass and lump, left lower limb: Secondary | ICD-10-CM | POA: Diagnosis not present

## 2022-09-21 DIAGNOSIS — I10 Essential (primary) hypertension: Secondary | ICD-10-CM | POA: Diagnosis not present

## 2022-09-21 DIAGNOSIS — M8588 Other specified disorders of bone density and structure, other site: Secondary | ICD-10-CM | POA: Diagnosis not present

## 2022-09-21 DIAGNOSIS — Z23 Encounter for immunization: Secondary | ICD-10-CM | POA: Diagnosis not present

## 2022-10-02 ENCOUNTER — Ambulatory Visit
Admission: RE | Admit: 2022-10-02 | Discharge: 2022-10-02 | Disposition: A | Discharge status: Home / Self Care | Payer: PPO | Source: Ambulatory Visit | Attending: Internal Medicine

## 2022-10-02 DIAGNOSIS — Z90722 Acquired absence of ovaries, bilateral: Secondary | ICD-10-CM | POA: Diagnosis not present

## 2022-10-02 DIAGNOSIS — M8588 Other specified disorders of bone density and structure, other site: Secondary | ICD-10-CM | POA: Diagnosis not present

## 2022-10-02 DIAGNOSIS — E349 Endocrine disorder, unspecified: Secondary | ICD-10-CM | POA: Diagnosis not present

## 2022-10-02 DIAGNOSIS — N958 Other specified menopausal and perimenopausal disorders: Secondary | ICD-10-CM | POA: Diagnosis not present

## 2022-11-15 NOTE — Progress Notes (Signed)
Chief Complaint  Patient presents with   Room 2    Pt is here Alone. Pt states that there hasn't been a lot of changes since her last appointment. Pt states that she would like to discuss her medications and other treatment options.     HISTORY OF PRESENT ILLNESS:  11/21/22 ALL:  Kathleen [name] returns for follow up for TN and migraines. We continued carbamazepine and added imipramine 25mg  at bedtime at last visit (not started from visit prior). Sumatriptan continued for abortive therapy and Tylenol Tension weaned. Since, she reports headaches continue. She describes more of a tension style headaches, usually around max sinuses about 20 days a month. She may have 1-2 migraines per month. TN is stable. No recent pain. BP is always elevated in doctor's offices. Home reading are usually 120-130/80-90. She has been hesitant to consider sleep study. She does wake with headaches on occasion. She does not think she snores. No dry mouth. She usually sleeps well.   05/09/2022 ALL:  Kathleen [name] returns for follow up for TN and migraines. She was last seen 12/2020 and reports difficulty with daily morning headaches. We added imipramine 25mg  QHS and continued carbamzepine 200mg  BID and Emgality monthly. Sumatriptan continued for abortive therapy and she was advised to avoid regular use of Excedrin. Discussed sleep eval. 6 month f/u advised. Since, she reports Emgality was not effective and was too expensive so she stopped injections. She did not try imipramine.   She reports TN pain is stable. She will have breakthrough pain for a few days about 3-4 times a year. Pain usually resolves spontaneously. She is tolerating carbamazepine without obvious adverse effects.   She continues to have daily headaches. She describes a bilateral retro orbital pain. Pain is throbbing/pounding pain. She has severe headaches just a few times a year but feels this is because she takes 4 tablets of Tylenol Tension every day. Sumatriptan does  help. Headaches are worse in the mornings. Occasionally she will wake with a headache but feels more often headaches worsen mid morning. Headache usually resolves by dinner time after taking Tylenol Tension. She usually sleeps well. She wakes once to use restroom. Sometimes has a harder time getting back to sleep. She does not snore. No dry mouth. Recent CPE with lab work reportedly unremarkable.   She lost her mother in 03/2021. She is followed by PCP for concerns of depression. She is considering duloxetine but PCP wanted her to discuss with [place] d/t being on carbamazepine.   Tried and failed: Emgality (not effective), Topiramate (made her sleepy), amitriptyline (ineffective), metoprolol (on now), sumatriptan (on now), Nurtec (too expensive)  12/27/2020 ALL: Kathleen [name] returns for follow up for TN and migraines. She continues carbamazepine 200mg  BID. Emgality was started 06/2020 for migraine prevention. She called 08/2020 with concerns of burning with injeciton but otherwise tolerating well. Since, TN pain is well managed. She reports that migraines may be a little better on Emgality but she continues to have daily tension type headaches. Most every day she has some sort of headache. Most start after getting moving during the day. Some do wake her from sleep. She doesn't think she snores. She is taking Excedrin most every day that helps. She continues feel stress is contributing. She is living with her mother who is 2.   07/05/2020 ALL:  Kathleen [name] is a 67 y.o. female here today for follow up for trigeminal neuralgia and migraines. She continues carbamazepine 200mg  BID. TN pain is well managed. Very  rare pain. She is having more headaches, recently. She has near daily headaches. Ajovy started 06/2019 for migraines but she never picked up injections. Sumatriptan continued for abortive therapy. It usually works well but she has had to use all 8 tablets each month. She takes Tylenol Tension headache at least 20  days a month. Nurtec was too expensive. She has a dull achy pain in the frontal/retro orbital region. She is has nausea. She has visual aura about once a month. Rest helps. 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 Amitriptyline helped her sleep but did not help headaches. She has annual physical with PCP. Labs have reportedly been normal. She did change BP meds in the past year. She is taking metoprolol 50mg  daily.   HISTORY (copied from Dr Richrd Humbles previous note)  UPDATE (06/30/19, VRP): Since last visit, doing well. TN sxs are better. HA are worse (daily migraine and tension HA). Amitriptyline not helping. Off nadolol now; planning to start metoprolol.    UPDATE (07/09/18, VRP):  - since last visit, TN symptoms are improved. Now on CBZ 200mg  twice a day. - continues with HA, with more stress in her life; has been off amitriptyline due to sedation; now on more Excedrin almost daily   UPDATE (05/14/17, VRP): Since last visit, doing well. Tolerating meds. No alleviating or aggravating factors. HA are stable. TN pain is mild, but still there, esp with brushing, touching or eating.   UPDATE 05/08/16 (VRP): Since last visit, doing well. MRI brain was unremarkable. Right lower facial numbness improved. Now on CBZ 400mg  twice a day. Had 2 flare ups of pain (each lasting 1 week), and now better. Still with intermittent HA. Using OTC meds and rx meds. Still with some stress issues.   UPDATE 11/03/15 (VRP): Since last visit sxs increased 3 weeks ago. Pt incr CBZ and sxs improved. Also with intermittent right lower facial numbness in last few months.   UPDATE 05/06/15 (VRP): Since last visit, overall stable. Some pulling right ear sensation. No severe right TN pain. HA are stable. Tolerating CBZ 200mg  BID + amitriptyline 25mg  qhs.   UPDATE 10/22/14 (VRP): Since last visit, no TN pain. Still with 4 headaches per week. Amitriptyline is helping.   UPDATE 04/16/14  (VRP): Since last visit, continues to have 3-4 tension HA per week, 0-1 migraine per month. Right trigeminal neuralgia pain is resolved, stable on CBZ 400mg  BID. Some ongoing stress. No other triggers. Left knee and hip pain limits physical activity.   UPDATE 10/09/13 (LL): Since last visit, she weaned off Topamax.  She does not notice any change in the frequency of headaches. States she continues to have 4 out of 7 days with a tension type headache.  She usually takes Excedrin on these days. Reduced her CBZ to 400 mg twice daily. Having less frequent severe Migraines, maybe 1-2 per month, in which she takes Imitrex for relief.  Sometimes takes 2 doses to feel better.   UPDATE 07/10/13 (LL): Since last visit, no facial pain or vibration. She thinks Topamax makes her more tired. Having just as many headaches, still 3-4 days per week. Sumatriptan helpful, takes Excedrin for milder headaches. Would like to wean off some medication if possible.   UPDATE 04/24/13 (LL): Since last visit, trigeminal neuralgia is stable on CBZ, but when dose is wearing off she feels "vibration" sensation sometimes on right side of mouth. Migraines are worse, averaging 3-4 days per week. Sometimes wakes  up with one in morning, sometimes comes on later in day. Most often bi-temporal or behind eyes. Sometimes in occipital region. At worst with nausea and vomiting. Positive for hyperosmia. Migraines last 4-6 hours. Sometimes relived with Excedrin. Imitrex will stop severe headache in usually an hour.   UPDATE 10/23/12: Since last visit, doing about the same. Baclofen 10mg  BID did not help that much more than carbamazepine 600mg  BID alone. Migraines worse for a while, but better with starting HCTZ and reducing Excedrin use.   UPDATE 04/09/12: Since last visit, doing about the same. Has not tried baclofen yet. Taking carbamazepine 600 mg twice a day. Still having some mild twinges of numbness and tingling on the right upper lip. No severe  painful attacks.   UPDATE 09/04/11: Doing better. Tolerating CBZ 600mg  BID. Uses baclofen prn. No side effects. Mild twinges of aches, but no severe pain.   UPDATE 05/01/11: Doing well. Better on CBZ. Mild cognitive side effects. Fell 4 weeks ago from [employer], broke left foot, but doesn't think it was medication related.   PRIOR HPI: 67 year old right-handed female with history of hypertension, migraine headache, here for evaluation of right facial pain. Patient has had intermittent episodes of electrical and lightening, shooting pain in her right jaw for over 10 years. Previous episodes lasted for only a few weeks at a time. She describes brief, electrical, radiating flashes of pain in her right jaw, up towards her right eye, and deeper in her neck and inside her ear. She is been evaluated by her dentist numerous times over the years without a specific dental pathology to explain these symptoms. She does have a history of dental procedures and crowns on her right side. Since 02/04/2011, patient's symptoms have been more severe and persistent. Has tried hydrocodone, gabapentin, without relief. She denies any numbness, weakness, slurred speech, vision problems. She has a first cousin who was diagnosed with multiple sclerosis and is deceased now.   REVIEW OF SYSTEMS: Out of a complete 14 system review of symptoms, the patient complains only of the following symptoms, headaches, facial pain, depression and all other reviewed systems are negative.   ALLERGIES: Allergies  Allergen Reactions   Morphine And Codeine Itching   Penicillins Diarrhea     HOME MEDICATIONS: Outpatient Medications Prior to Visit  Medication Sig Dispense Refill   Acetaminophen-Caffeine (EXCEDRIN TENSION HEADACHE PO) Take by mouth.     Ascorbic Acid (VITAMIN C) 1000 MG tablet 1 tablet     calcium carbonate (OS-CAL) 600 MG TABS Take 600 mg by mouth 2 (two) times daily with a meal.     cholecalciferol (VITAMIN D) 400 units TABS  tablet Take 400 Units by mouth.     famotidine (PEPCID) 20 MG tablet Take 20 mg by mouth daily.     metoprolol succinate (TOPROL-XL) 50 MG 24 hr tablet Take 50 mg by mouth daily.     Omega-3 Fatty Acids (FISH OIL) 1200 MG CAPS Take by mouth.     rosuvastatin (CRESTOR) 10 MG tablet Take 10 mg by mouth at bedtime.     SUMAtriptan (IMITREX) 100 MG tablet Take 1 tablet (100 mg total) by mouth once as needed for migraine. May repeat x 1 after 2 hours; maximum 2 tabs per day and 8 tabs per month 8 tablet 12   carbamazepine (TEGRETOL XR) 200 MG 12 hr tablet Take 1 tablet (200 mg total) by mouth 2 (two) times daily. 180 tablet 3   imipramine (TOFRANIL) 25 MG tablet Take 1  tablet (25 mg total) by mouth at bedtime. 90 tablet 3   No facility-administered medications prior to visit.     PAST MEDICAL HISTORY: Past Medical History:  Diagnosis Date   Headache    Hypertension    Trigeminal neuralgia      PAST SURGICAL HISTORY: Past Surgical History:  Procedure Laterality Date   VAGINAL HYSTERECTOMY       FAMILY HISTORY: Family History  Problem Relation Age of Onset   Atrial fibrillation Mother    Hypertension Mother    Arthritis Mother    Kidney disease Mother    Heart attack Father    Liver disease Sister    Bipolar disorder Sister    Bone cancer Other      SOCIAL HISTORY: Social History   Socioeconomic History   Marital status: Married    Spouse name: Harrison Mons   Number of children: 1   Years of education: HS   Highest education level: Not on file  Occupational History    Comment: Surveyor, quantity  Tobacco Use   Smoking status: Never    Passive exposure: Never   Smokeless tobacco: Never  Vaping Use   Vaping status: Never Used  Substance and Sexual Activity   Alcohol use: No   Drug use: No   Sexual activity: Never  Other Topics Concern   Not on file  Social History Narrative   Caffeine Use- She drinks coffee and sodas on occasion.   05/08/16, 07/08/18 Patient lives at  home with spouse.    Right handed   Social Determinants of Health   Financial Resource Strain: Not on file  Food Insecurity: Not on file  Transportation Needs: Not on file  Physical Activity: Not on file  Stress: Not on file  Social Connections: Not on file  Intimate Partner Violence: Not on file     PHYSICAL EXAM  Vitals:   11/21/22 1034  BP: (!) 155/105  Pulse: (!) 102  Weight: 177 lb 8 oz (80.5 kg)  Height: 5\' 3"  (1.6 m)      Body mass index is 31.44 kg/m.   Generalized: Well developed, in no acute distress  Cardiology: normal rate and rhythm, no murmur auscultated  Respiratory: clear to auscultation bilaterally    Neurological examination  Mentation: Alert oriented to time, place, history taking. Follows all commands speech and language fluent Cranial nerve II-XII: Pupils were equal round reactive to light. Extraocular movements were full, visual field were full on confrontational test. Facial sensation and strength were normal. Uvula tongue midline. Head turning and shoulder shrug  were normal and symmetric. Motor: The motor testing reveals 5 over 5 strength of all 4 extremities. Good symmetric motor tone is noted throughout.  Sensory: Sensory testing is intact to soft touch on all 4 extremities. No evidence of extinction is noted.  Coordination: Cerebellar testing reveals good finger-nose-finger and heel-to-shin bilaterally.  Gait and station: Gait is normal. Tandem gait is normal. Romberg is negative. No drift is seen.  Reflexes: Deep tendon reflexes are symmetric and normal bilaterally.    DIAGNOSTIC DATA (LABS, IMAGING, TESTING) - I reviewed patient records, labs, notes, testing and imaging myself where available.  No results found for: "WBC", "HGB", "HCT", "MCV", "PLT"    Component Value Date/Time   NA 139 05/09/2022 1051   No results found for: "CHOL", "HDL", "LDLCALC", "LDLDIRECT", "TRIG", "CHOLHDL" No results found for: "HGBA1C" No results found  for: "VITAMINB12" No results found for: "TSH"      No data  to display               No data to display           ASSESSMENT AND PLAN  67 y.o. year old female  has a past medical history of Headache, Hypertension, and Trigeminal neuralgia. here with    Trigeminal neuralgia - Plan: carbamazepine (TEGRETOL XR) 200 MG 12 hr tablet  Migraine with aura and without status migrainosus, not intractable  Kathleen [name] reports that trigeminal pain is well managed on carbamazepine. We will continue 200mg  twice daily for now. We will increase imipramine to 50mg  daily at bedtime. She will continue sumatriptan and Tylenol Tension as needed but advised against regular use of abortive medicaitons. We have discussed weaning carbamazepine if headaches are better with increased dose of imipramine. We have discussed considering a sleep eval. No other signs of sleep apnea outside of morning headaches. She will think about this. Consider counseling for mood management. Exercise also encouraged. Healthy lifestyle habits reviewed. She will follow up in 6 months.   No orders of the defined types were placed in this encounter.     Meds ordered this encounter  Medications   imipramine (TOFRANIL) 25 MG tablet    Sig: Take 2 tablets (50 mg total) by mouth at bedtime.    Dispense:  180 tablet    Refill:  3    Order Specific Question:   Supervising Provider    Answer:   Anson Fret [[id number]]   carbamazepine (TEGRETOL XR) 200 MG 12 hr tablet    Sig: Take 1 tablet (200 mg total) by mouth 2 (two) times daily.    Dispense:  180 tablet    Refill:  3    Order Specific Question:   Supervising Provider    Answer:   Anson Fret [id number]    I spent 30 minutes of face-to-face and non-face-to-face time with patient.  This included previsit chart review, lab review, study review, order entry, electronic health record documentation, patient education.   Kathleen Dapper, MSN, FNP-C 11/21/2022, 11:18  AM  [facility] [address] [place], [place] [address] [phone]

## 2022-11-15 NOTE — Patient Instructions (Addendum)
Below is our plan:  We will increased imipramine to 50mg  at bedtime. Take 1.5 tablets daily for 1 week then increase to 2 tablets daily. Continue carbamazepine 200mg  BID for now. In about 6-8 weeks, if doing well, you can reduce dose of carbamazepine to 200mg  day 1 and 200mg  twice daily on day 2, alternate dose each day. Call me with progress report in 4 weeks.   Consider having a sleep study with one of our sleep docs.   Please make sure you are staying well hydrated. I recommend 50-60 ounces daily. Well balanced diet and regular exercise encouraged. Consistent sleep schedule with 6-8 hours recommended.   Please continue follow up with care team as directed.   Follow up with me in 6 months   You may receive a survey regarding today's visit. I encourage you to leave honest feed back as I do use this information to improve patient care. Thank you for seeing me today!   GENERAL HEADACHE INFORMATION:   Natural supplements: Magnesium Oxide or Magnesium Glycinate 500 mg at bed (up to 800 mg daily) Coenzyme Q10 300 mg in AM Vitamin B2- 200 mg twice a day   Add 1 supplement at a time since even natural supplements can have undesirable side effects. You can sometimes buy supplements cheaper (especially Coenzyme Q10) at www.WebmailGuide.co.za or at [facility].  Migraine with aura: There is increased risk for stroke in women with migraine with aura and a contraindication for the combined contraceptive pill for use by women who have migraine with aura. The risk for women with migraine without aura is lower. However other risk factors like smoking are far more likely to increase stroke risk than migraine. There is a recommendation for no smoking and for the use of OCPs without estrogen such as progestogen only pills particularly for women with migraine with aura.Marland Kitchen People who have migraine headaches with auras may be 3 times more likely to have a stroke caused by a blood clot, compared to migraine patients who don't  see auras. Women who take hormone-replacement therapy may be 30 percent more likely to suffer a clot-based stroke than women not taking medication containing estrogen. Other risk factors like smoking and high blood pressure may be  much more important.    Vitamins and herbs that show potential:   Magnesium: Magnesium (250 mg twice a day or 500 mg at bed) has a relaxant effect on smooth muscles such as blood vessels. Individuals suffering from frequent or daily headache usually have low magnesium levels which can be increase with daily supplementation of 400-750 mg. Three trials found 40-90% average headache reduction  when used as a preventative. Magnesium may help with headaches are aura, the best evidence for magnesium is for migraine with aura is its thought to stop the cortical spreading depression we believe is the pathophysiology of migraine aura.Magnesium also demonstrated the benefit in menstrually related migraine.  Magnesium is part of the messenger system in the serotonin cascade and it is a good muscle relaxant.  It is also useful for constipation which can be a side effect of other medications used to treat migraine. Good sources include nuts, whole grains, and tomatoes. Side Effects: loose stool/diarrhea  Riboflavin (vitamin B 2) 200 mg twice a day. This vitamin assists nerve cells in the production of ATP a principal energy storing molecule.  It is necessary for many chemical reactions in the body.  There have been at least 3 clinical trials of riboflavin using 400 mg per day  all of which suggested that migraine frequency can be decreased.  All 3 trials showed significant improvement in over half of migraine sufferers.  The supplement is found in bread, cereal, milk, meat, and poultry.  Most Americans get more riboflavin than the recommended daily allowance, however riboflavin deficiency is not necessary for the supplements to help prevent headache. Side effects: energizing, green urine    Coenzyme Q10: This is present in almost all cells in the body and is critical component for the conversion of energy.  Recent studies have shown that a nutritional supplement of CoQ10 can reduce the frequency of migraine attacks by improving the energy production of cells as with riboflavin.  Doses of 150 mg twice a day have been shown to be effective.   Melatonin: Increasing evidence shows correlation between melatonin secretion and headache conditions.  Melatonin supplementation has decreased headache intensity and duration.  It is widely used as a sleep aid.  Sleep is natures way of dealing with migraine.  A dose of 3 mg is recommended to start for headaches including cluster headache. Higher doses up to 15 mg has been reviewed for use in Cluster headache and have been used. The rationale behind using melatonin for cluster is that many theories regarding the cause of Cluster headache center around the disruption of the normal circadian rhythm in the brain.  This helps restore the normal circadian rhythm.   HEADACHE DIET: Foods and beverages which may trigger migraine Note that only 20% of headache patients are food sensitive. You will know if you are food sensitive if you get a headache consistently 20 minutes to 2 hours after eating a certain food. Only cut out a food if it causes headaches, otherwise you might remove foods you enjoy! What matters most for diet is to eat a well balanced healthy diet full of vegetables and low fat protein, and to not miss meals.   Chocolate, other sweets ALL cheeses except cottage and cream cheese Dairy products, yogurt, sour cream, ice cream Liver Meat extracts (Bovril, Marmite, meat tenderizers) Meats or fish which have undergone aging, fermenting, pickling or smoking. These include: Hotdogs,salami,Lox,sausage, mortadellas,smoked salmon, pepperoni, Pickled herring Pods of broad bean (English beans, Chinese pea pods, [place] (fava) beans, lima and navy  beans Ripe avocado, ripe banana Yeast extracts or active yeast preparations such as Brewer's or Fleishman's (commercial bakes goods are permitted) Tomato based foods, pizza (lasagna, etc.)   MSG (monosodium glutamate) is disguised as many things; look for these common aliases: Monopotassium glutamate Autolysed yeast Hydrolysed protein Sodium caseinate "flavorings" "all natural preservatives" Nutrasweet   Avoid all other foods that convincingly provoke headaches.   Resources: The Dizzy [place] Laundry Your Headache Diet, migrainestrong.com  https://zamora-andrews.com/   Caffeine and Migraine For patients that have migraine, caffeine intake more than 3 days per week can lead to dependency and increased migraine frequency. I would recommend cutting back on your caffeine intake as best you can. The recommended amount of caffeine is 200-300 mg daily, although migraine patients may experience dependency at even lower doses. While you may notice an increase in headache temporarily, cutting back will be helpful for headaches in the long run. For more information on caffeine and migraine, visit: https://americanmigrainefoundation.org/resource-library/caffeine-and-migraine/   Headache Prevention Strategies:   1. Maintain a headache diary; learn to identify and avoid triggers.  - This can be a simple note where you log when you had a headache, associated symptoms, and medications used - There are several smartphone apps developed to help  track migraines: Migraine Buddy, Migraine Monitor, Curelator N1-Headache App   Common triggers include: Emotional triggers: Emotional/Upset family or friends Emotional/Upset occupation Business reversal/success Anticipation anxiety Crisis-serious Post-crisis periodNew job/position   Physical triggers: Vacation Day Weekend Strenuous Exercise High Altitude Location New Move Menstrual Day Physical  Illness Oversleep/Not enough sleep Weather changes Light: Photophobia or light sesnitivity treatment involves a balance between desensitization and reduction in overly strong input. Use dark polarized glasses outside, but not inside. Avoid bright or fluorescent light, but do not dim environment to the point that going into a normally lit room hurts. Consider FL-41 tint lenses, which reduce the most irritating wavelengths without blocking too much light.  These can be obtained at axonoptics.com or theraspecs.com Foods: see list above.   2. Limit use of acute treatments (over-the-counter medications, triptans, etc.) to no more than 2 days per week or 10 days per month to prevent medication overuse headache (rebound headache).     3. Follow a regular schedule (including weekends and holidays): Don't skip meals. Eat a balanced diet. 8 hours of sleep nightly. Minimize stress. Exercise 30 minutes per day. Being overweight is associated with a 5 times increased risk of chronic migraine. Keep well hydrated and drink 6-8 glasses of water per day.   4. Initiate non-pharmacologic measures at the earliest onset of your headache. Rest and quiet environment. Relax and reduce stress. Breathe2Relax is a free app that can instruct you on    some simple relaxtion and breathing techniques. Http://Dawnbuse.com is a    free website that provides teaching videos on relaxation.  Also, there are  many apps that   can be downloaded for "mindful" relaxation.  An app called YOGA NIDRA will help walk you through mindfulness. Another app called Calm can be downloaded to give you a structured mindfulness guide with daily reminders and skill development. Headspace for guided meditation Mindfulness Based Stress Reduction Online Course: www.palousemindfulness.com Cold compresses.   5. Don't wait!! Take the maximum allowable dosage of prescribed medication at the first sign of migraine.   6. Compliance:  Take prescribed  medication regularly as directed and at the first sign of a migraine.   7. Communicate:  Call your physician when problems arise, especially if your headaches change, increase in frequency/severity, or become associated with neurological symptoms (weakness, numbness, slurred speech, etc.). Proceed to emergency room if you experience new or worsening symptoms or symptoms do not resolve, if you have new neurologic symptoms or if headache is severe, or for any concerning symptom.   8. Headache/pain management therapies: Consider various complementary methods, including medication, behavioral therapy, psychological counselling, biofeedback, massage therapy, acupuncture, dry needling, and other modalities.  Such measures may reduce the need for medications. Counseling for pain management, where patients learn to function and ignore/minimize their pain, seems to work very well.   9. Recommend changing family's attention and focus away from patient's headaches. Instead, emphasize daily activities. If first question of day is 'How are your headaches/Do you have a headache today?', then patient will constantly think about headaches, thus making them worse. Goal is to re-direct attention away from headaches, toward daily activities and other distractions.   10. Helpful Websites: www.AmericanHeadacheSociety.org PatentHood.ch www.headaches.org TightMarket.nl www.achenet.org

## 2022-11-21 ENCOUNTER — Encounter: Payer: Self-pay | Admitting: Family Medicine

## 2022-11-21 ENCOUNTER — Ambulatory Visit: Payer: PPO | Admitting: Family Medicine

## 2022-11-21 VITALS — BP 155/105 | HR 102 | Ht 63.0 in | Wt 177.5 lb

## 2022-11-21 DIAGNOSIS — G500 Trigeminal neuralgia: Secondary | ICD-10-CM | POA: Diagnosis not present

## 2022-11-21 DIAGNOSIS — G43109 Migraine with aura, not intractable, without status migrainosus: Secondary | ICD-10-CM

## 2022-11-21 MED ORDER — IMIPRAMINE HCL 25 MG PO TABS: 50.0000 mg | ORAL_TABLET | Freq: Every day | ORAL | 3 refills | Status: AC

## 2022-11-21 MED ORDER — CARBAMAZEPINE ER 200 MG PO TB12: 200.0000 mg | ORAL_TABLET | Freq: Two times a day (BID) | ORAL | 3 refills | Status: DC

## 2022-11-25 IMAGING — MG MM DIGITAL SCREENING BILAT W/ TOMO AND CAD
8 series · 8 of 24 positions shown · non-contrast
Comparison: Previous exam(s).

CLINICAL DATA: Screening.

EXAM:
DIGITAL SCREENING BILATERAL MAMMOGRAM WITH TOMOSYNTHESIS AND CAD
TECHNIQUE: Bilateral screening digital craniocaudal and mediolateral oblique
mammograms were obtained. Bilateral screening digital breast
tomosynthesis was performed. The images were evaluated with
computer-aided detection.

[R MLO synth-2D]
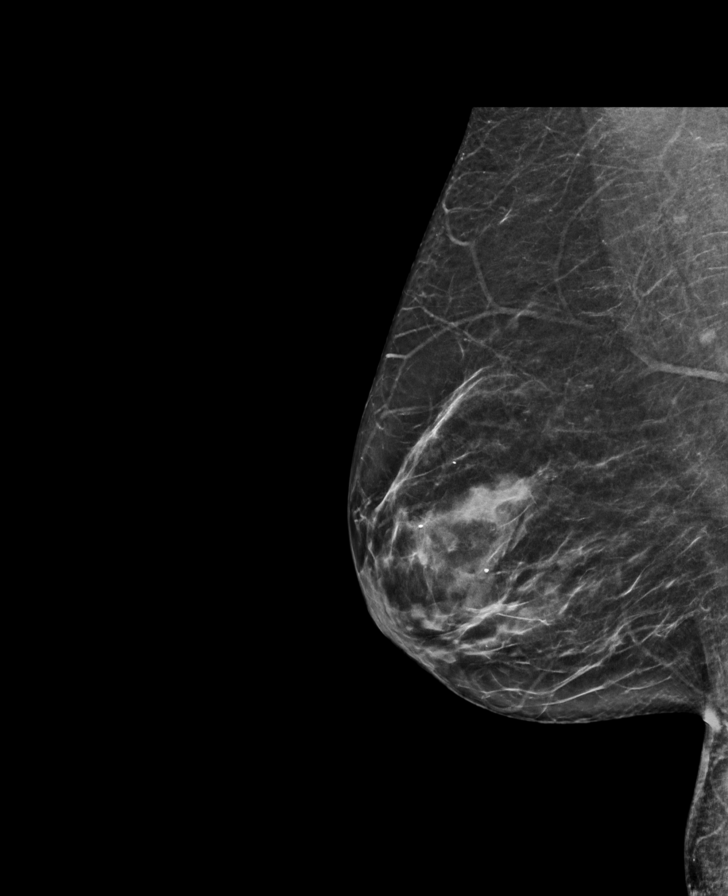

[L CC synth-2D]
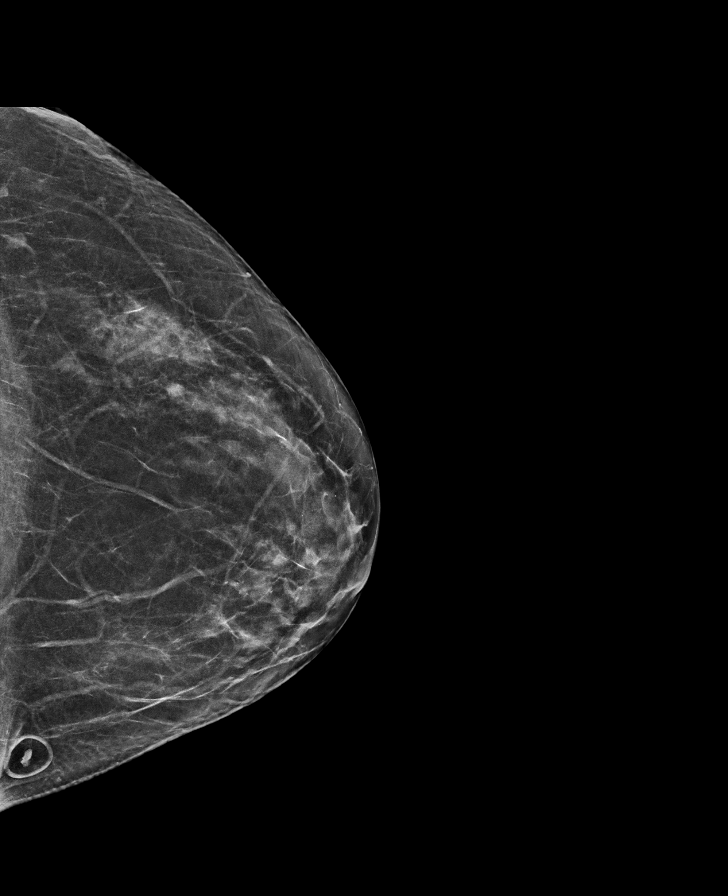

[L MLO synth-2D]
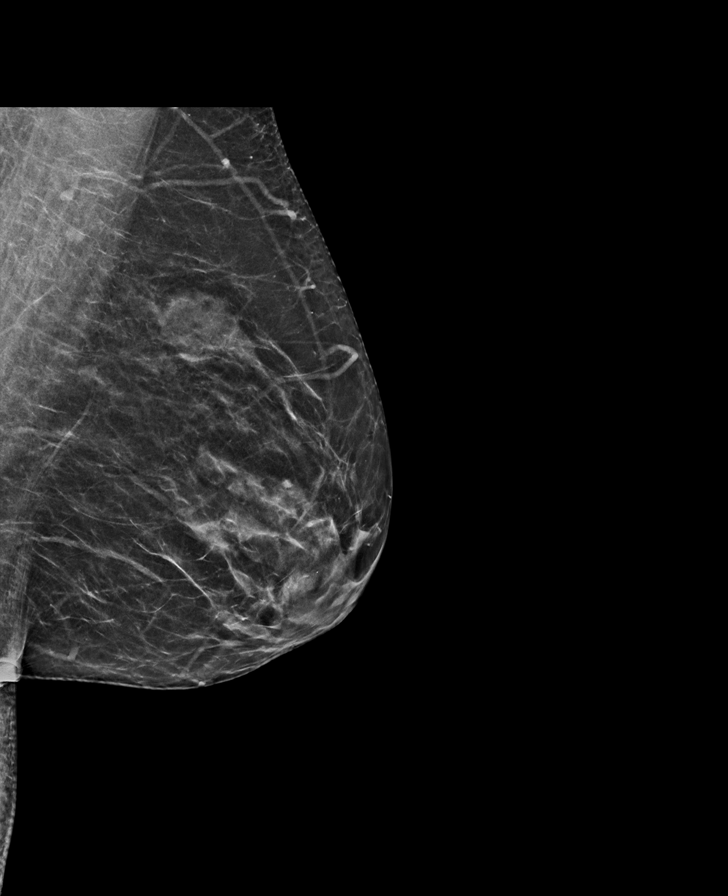

[R CC synth-2D]
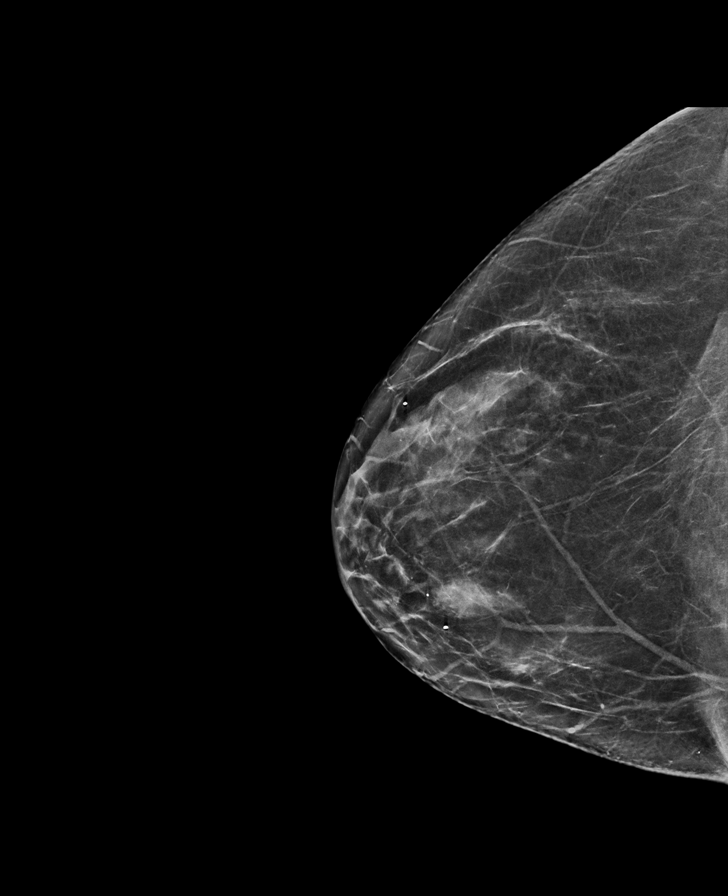

[R CC tomo · tomo slice 35/69.0]
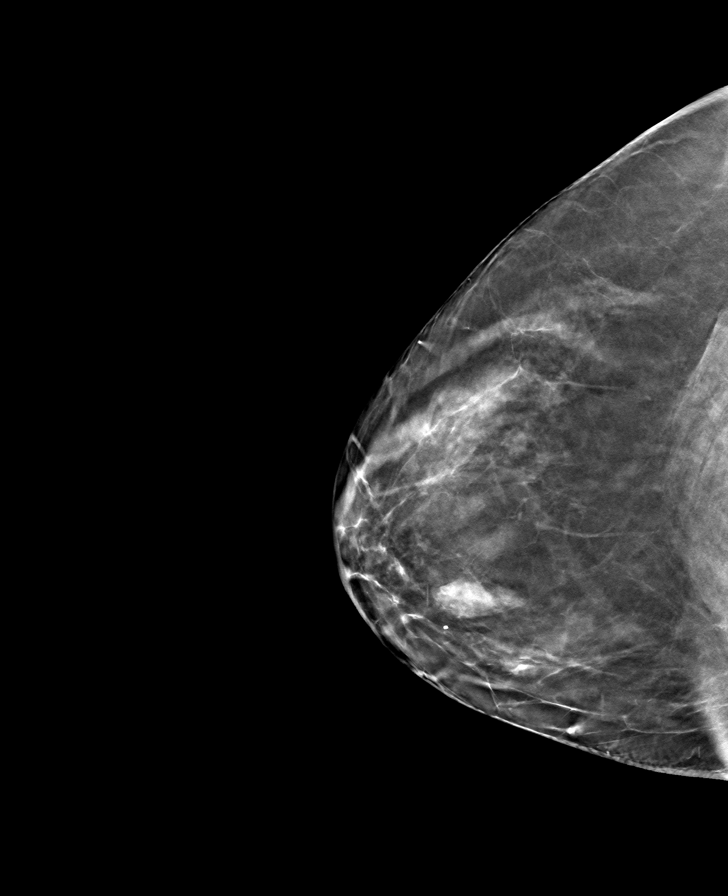

[L MLO tomo · tomo slice 32/63.0]
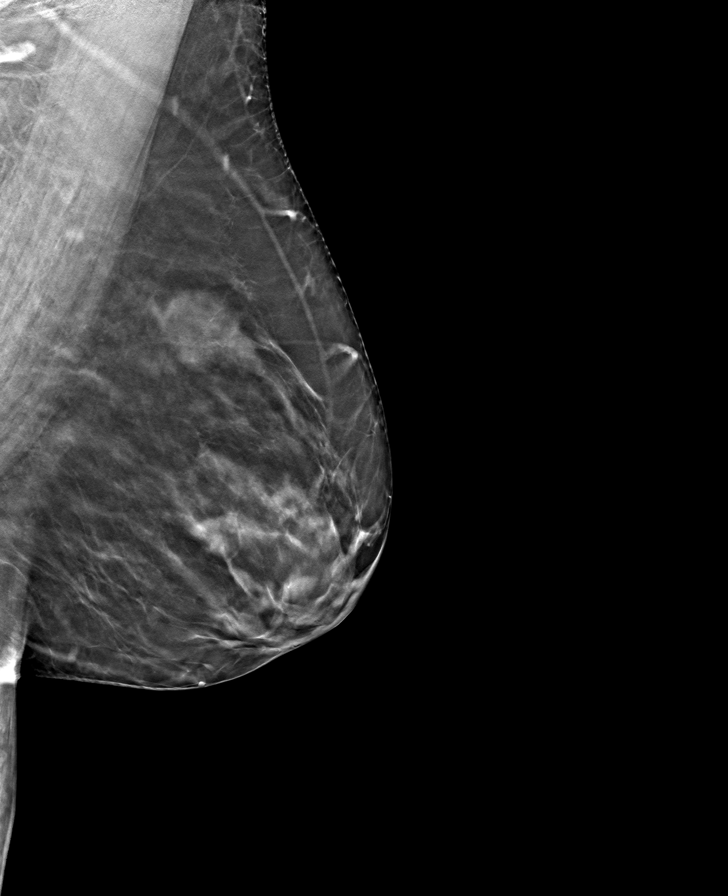

[R MLO tomo · tomo slice 33/66.0]
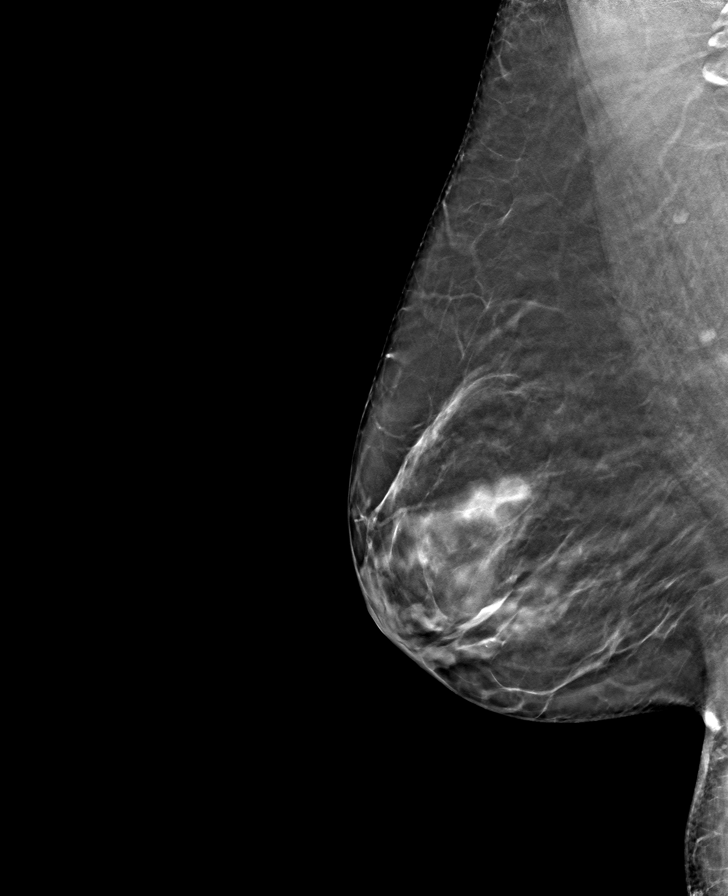

[L CC tomo · tomo slice 32/63.0]
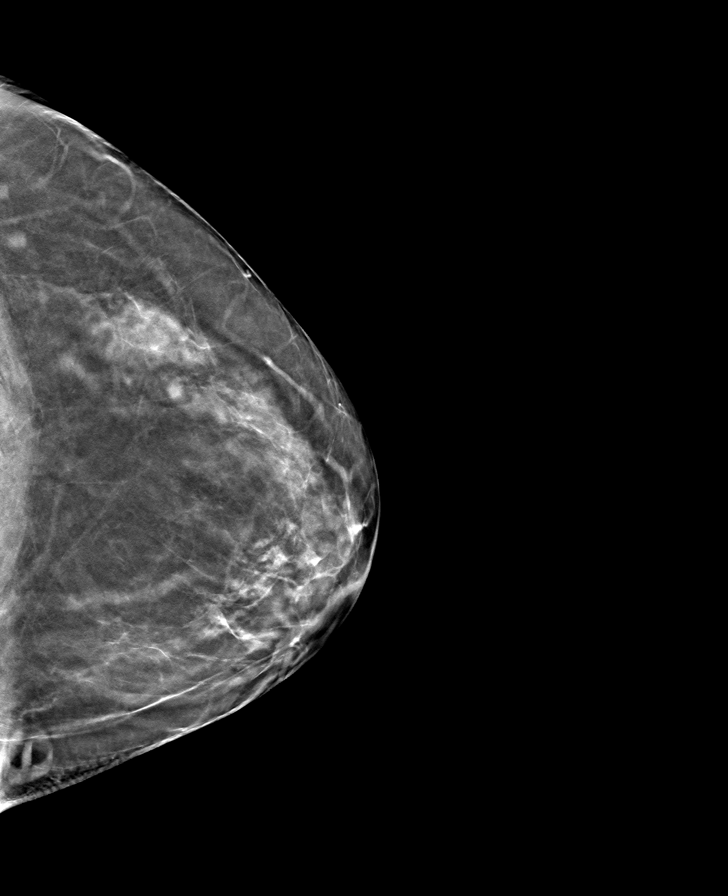

[8 of 24 positions shown; findings below may reference images not displayed]

ACR Breast Density Category b: There are scattered areas of
fibroglandular density.
FINDINGS: There are no findings suspicious for malignancy. The images were
evaluated with computer-aided detection.
IMPRESSION: No mammographic evidence of malignancy. A result letter of this
screening mammogram will be mailed directly to the patient.

RECOMMENDATION:
Screening mammogram in one year. (Code:[id number])

BI-RADS CATEGORY  1: Negative.

## 2023-06-21 NOTE — Progress Notes (Signed)
 Chief Complaint  Patient presents with   Follow-up    RM 2, alone. Having more headaches. Has worsening TN pain on right side when she eats sometimes or fan blowing on that spot.     HISTORY OF PRESENT ILLNESS:  06/25/23 ALL:  Kathleen [name] returns for follow up for TN and migraines. She was last seen 11/2022. We continued carbamazepine  200mg  twice daily and increased imipramine  to 50mg  daily at bedtime. Sumatriptan  and Tylenol  Tension as needed. Since, she reports stopping imipramine . She missed some doses in 02/2023 after being diagnosed with influenza. She did not note any significant benefit and symptoms have not worsened since. Pain is stable. She may have a twinge of pain when eating or if cold air hit her face. She rarely has migraines. She has not taken sumatriptan , recently. She does have tension style headaches that she describes as mild nagging headaches in the back of her head/neck area. She is taking Excedrin 4-5 times a week. She has worked on a [employer] and more water intake.   She also notes more fatigue and generalized weakness over the past few weeks. She was recently treated for diverticulitis and has not felt as well since. She has a history of tachycardia on metoprolol but reports frequent palpitations. She denies chest pain. Occasionally winded with walking.   11/21/2022 ALL:  Kathleen [name] returns for follow up for TN and migraines. We continued carbamazepine  and added imipramine  25mg  at bedtime at last visit (not started from visit prior). Sumatriptan  continued for abortive therapy and Tylenol  Tension weaned. Since, she reports headaches continue. She describes more of a tension style headaches, usually around max sinuses about 20 days a month. She may have 1-2 migraines per month. TN is stable. No recent pain. BP is always elevated in doctor's offices. Home reading are usually 120-130/80-90. She has been hesitant to consider sleep study. She does wake with headaches on occasion.  She does not think she snores. No dry mouth. She usually sleeps well.   05/09/2022 ALL:  Kathleen [name] returns for follow up for TN and migraines. She was last seen 12/2020 and reports difficulty with daily morning headaches. We added imipramine  25mg  QHS and continued carbamzepine 200mg  BID and Emgality  monthly. Sumatriptan  continued for abortive therapy and she was advised to avoid regular use of Excedrin. Discussed sleep eval. 6 month f/u advised. Since, she reports Emgality  was not effective and was too expensive so she stopped injections. She did not try imipramine .   She reports TN pain is stable. She will have breakthrough pain for a few days about 3-4 times a year. Pain usually resolves spontaneously. She is tolerating carbamazepine  without obvious adverse effects.   She continues to have daily headaches. She describes a bilateral retro orbital pain. Pain is throbbing/pounding pain. She has severe headaches just a few times a year but feels this is because she takes 4 tablets of Tylenol  Tension every day. Sumatriptan  does help. Headaches are worse in the mornings. Occasionally she will wake with a headache but feels more often headaches worsen mid morning. Headache usually resolves by dinner time after taking Tylenol  Tension. She usually sleeps well. She wakes once to use restroom. Sometimes has a harder time getting back to sleep. She does not snore. No dry mouth. Recent CPE with lab work reportedly unremarkable.   She lost her mother in 03/2021. She is followed by PCP for concerns of depression. She is considering duloxetine but PCP wanted her to discuss with us  d/t being  on carbamazepine .   Tried and failed: Emgality  (not effective), Topiramate  (made her sleepy), amitriptyline  (ineffective), metoprolol (on now), sumatriptan  (on now), Nurtec (too expensive)  12/27/2020 ALL: Kathleen [name] returns for follow up for TN and migraines. She continues carbamazepine  200mg  BID. Emgality  was started 06/2020 for migraine  prevention. She called 08/2020 with concerns of burning with injeciton but otherwise tolerating well. Since, TN pain is well managed. She reports that migraines may be a little better on Emgality  but she continues to have daily tension type headaches. Most every day she has some sort of headache. Most start after getting moving during the day. Some do wake her from sleep. She doesn't think she snores. She is taking Excedrin most every day that helps. She continues feel stress is contributing. She is living with her mother who is 91.   07/05/2020 ALL:  Kathleen [name] is a 68 y.o. female here today for follow up for trigeminal neuralgia and migraines. She continues carbamazepine  200mg  BID. TN pain is well managed. Very rare pain. She is having more headaches, recently. She has near daily headaches. Ajovy  started 06/2019 for migraines but she never picked up injections. Sumatriptan  continued for abortive therapy. It usually works well but she has had to use all 8 tablets each month. She takes Tylenol  Tension headache at least 20 days a month. Nurtec was too expensive. She has a dull achy pain in the frontal/retro orbital region. She is has nausea. She has visual aura about once a month. Rest helps. 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 Amitriptyline  helped her sleep but did not help headaches. She has annual physical with PCP. Labs have reportedly been normal. She did change BP meds in the past year. She is taking metoprolol 50mg  daily.   HISTORY (copied from Dr Elon Hakim previous note)  UPDATE (06/30/19, VRP): Since last visit, doing well. TN sxs are better. HA are worse (daily migraine and tension HA). Amitriptyline  not helping. Off nadolol now; planning to start metoprolol.    UPDATE (07/09/18, VRP):  - since last visit, TN symptoms are improved. Now on CBZ 200mg  twice a day. - continues with HA, with more stress in her life; has been off amitriptyline  due to  sedation; now on more Excedrin almost daily   UPDATE (05/14/17, VRP): Since last visit, doing well. Tolerating meds. No alleviating or aggravating factors. HA are stable. TN pain is mild, but still there, esp with brushing, touching or eating.   UPDATE 05/08/16 (VRP): Since last visit, doing well. MRI brain was unremarkable. Right lower facial numbness improved. Now on CBZ 400mg  twice a day. Had 2 flare ups of pain (each lasting 1 week), and now better. Still with intermittent HA. Using OTC meds and rx meds. Still with some stress issues.   UPDATE 11/03/15 (VRP): Since last visit sxs increased 3 weeks ago. Pt incr CBZ and sxs improved. Also with intermittent right lower facial numbness in last few months.   UPDATE 05/06/15 (VRP): Since last visit, overall stable. Some pulling right ear sensation. No severe right TN pain. HA are stable. Tolerating CBZ 200mg  BID + amitriptyline  25mg  qhs.   UPDATE 10/22/14 (VRP): Since last visit, no TN pain. Still with 4 headaches per week. Amitriptyline  is helping.   UPDATE 04/16/14 (VRP): Since last visit, continues to have 3-4 tension HA per week, 0-1 migraine per month. Right trigeminal neuralgia pain is resolved, stable on CBZ 400mg  BID. Some ongoing stress. No other  triggers. Left knee and hip pain limits physical activity.   UPDATE 10/09/13 (LL): Since last visit, she weaned off Topamax .  She does not notice any change in the frequency of headaches. States she continues to have 4 out of 7 days with a tension type headache.  She usually takes Excedrin on these days. Reduced her CBZ to 400 mg twice daily. Having less frequent severe Migraines, maybe 1-2 per month, in which she takes Imitrex  for relief.  Sometimes takes 2 doses to feel better.   UPDATE 07/10/13 (LL): Since last visit, no facial pain or vibration. She thinks Topamax  makes her more tired. Having just as many headaches, still 3-4 days per week. Sumatriptan  helpful, takes Excedrin for milder headaches. Would  like to wean off some medication if possible.   UPDATE 04/24/13 (LL): Since last visit, trigeminal neuralgia is stable on CBZ, but when dose is wearing off she feels vibration sensation sometimes on right side of mouth. Migraines are worse, averaging 3-4 days per week. Sometimes wakes up with one in morning, sometimes comes on later in day. Most often bi-temporal or behind eyes. Sometimes in occipital region. At worst with nausea and vomiting. Positive for hyperosmia. Migraines last 4-6 hours. Sometimes relived with Excedrin. Imitrex  will stop severe headache in usually an hour.   UPDATE 10/23/12: Since last visit, doing about the same. Baclofen  10mg  BID did not help that much more than carbamazepine  600mg  BID alone. Migraines worse for a while, but better with starting HCTZ and reducing Excedrin use.   UPDATE 04/09/12: Since last visit, doing about the same. Has not tried baclofen  yet. Taking carbamazepine  600 mg twice a day. Still having some mild twinges of numbness and tingling on the right upper lip. No severe painful attacks.   UPDATE 09/04/11: Doing better. Tolerating CBZ 600mg  BID. Uses baclofen  prn. No side effects. Mild twinges of aches, but no severe pain.   UPDATE 05/01/11: Doing well. Better on CBZ. Mild cognitive side effects. Fell 4 weeks ago from [employer], broke left foot, but doesn't think it was medication related.   PRIOR HPI: 68 year old right-handed female with history of hypertension, migraine headache, here for evaluation of right facial pain. Patient has had intermittent episodes of electrical and lightening, shooting pain in her right jaw for over 10 years. Previous episodes lasted for only a few weeks at a time. She describes brief, electrical, radiating flashes of pain in her right jaw, up towards her right eye, and deeper in her neck and inside her ear. She is been evaluated by her dentist numerous times over the years without a specific dental pathology to explain these symptoms.  She does have a history of dental procedures and crowns on her right side. Since 02/04/2011, patient's symptoms have been more severe and persistent. Has tried hydrocodone, gabapentin, without relief. She denies any numbness, weakness, slurred speech, vision problems. She has a first cousin who was diagnosed with multiple sclerosis and is deceased now.   REVIEW OF SYSTEMS: Out of a complete 14 system review of symptoms, the patient complains only of the following symptoms, headaches, facial pain, palpitations, depression and all other reviewed systems are negative.   ALLERGIES: Allergies  Allergen Reactions   Morphine And Codeine Itching   Penicillins Diarrhea     HOME MEDICATIONS: Outpatient Medications Prior to Visit  Medication Sig Dispense Refill   Acetaminophen -Caffeine (EXCEDRIN TENSION HEADACHE PO) Take by mouth.     Ascorbic Acid (VITAMIN C) 1000 MG tablet 1 tablet  calcium carbonate (OS-CAL) 600 MG TABS Take 600 mg by mouth 2 (two) times daily with a meal.     cholecalciferol (VITAMIN D) 400 units TABS tablet Take 400 Units by mouth.     famotidine (PEPCID) 20 MG tablet Take 20 mg by mouth daily.     imipramine  (TOFRANIL ) 25 MG tablet Take 2 tablets (50 mg total) by mouth at bedtime. 180 tablet 3   metoprolol succinate (TOPROL-XL) 50 MG 24 hr tablet Take 50 mg by mouth daily.     Omega-3 Fatty Acids (FISH OIL) 1200 MG CAPS Take by mouth.     rosuvastatin (CRESTOR) 10 MG tablet Take 10 mg by mouth at bedtime.     carbamazepine  (TEGRETOL  XR) 200 MG 12 hr tablet Take 1 tablet (200 mg total) by mouth 2 (two) times daily. 180 tablet 3   SUMAtriptan  (IMITREX ) 100 MG tablet Take 1 tablet (100 mg total) by mouth once as needed for migraine. May repeat x 1 after 2 hours; maximum 2 tabs per day and 8 tabs per month 8 tablet 12   No facility-administered medications prior to visit.     PAST MEDICAL HISTORY: Past Medical History:  Diagnosis Date   Headache    Hypertension     Trigeminal neuralgia      PAST SURGICAL HISTORY: Past Surgical History:  Procedure Laterality Date   VAGINAL HYSTERECTOMY       FAMILY HISTORY: Family History  Problem Relation Age of Onset   Atrial fibrillation Mother    Hypertension Mother    Arthritis Mother    Kidney disease Mother    Heart attack Father    Liver disease Sister    Bipolar disorder Sister    Bone cancer Other      SOCIAL HISTORY: Social History   Socioeconomic History   Marital status: Married    Spouse name: Tiny Forest   Number of children: 1   Years of education: HS   Highest education level: Not on file  Occupational History    Comment: Surveyor, quantity  Tobacco Use   Smoking status: Never    Passive exposure: Never   Smokeless tobacco: Never  Vaping Use   Vaping status: Never Used  Substance and Sexual Activity   Alcohol use: No   Drug use: No   Sexual activity: Never  Other Topics Concern   Not on file  Social History Narrative   Caffeine Use- She drinks coffee and sodas on occasion.   05/08/16, 07/08/18 Patient lives at home with spouse.    Right handed   Social Drivers of Corporate investment banker Strain: Not on file  Food Insecurity: Not on file  Transportation Needs: Not on file  Physical Activity: Not on file  Stress: Not on file  Social Connections: Not on file  Intimate Partner Violence: Not on file     PHYSICAL EXAM  Vitals:   06/25/23 1035  BP: 132/80  Pulse: 100  SpO2: 95%  Weight: 177 lb 8 oz (80.5 kg)  Height: 5' 3 (1.6 m)     Body mass index is 31.44 kg/m.   Generalized: Well developed, in no acute distress  Cardiology: normal rate. Abnormal rhythm noted, possible PVCs, no murmur auscultated  Respiratory: clear to auscultation bilaterally    Neurological examination  Mentation: Alert oriented to time, place, history taking. Follows all commands speech and language fluent Cranial nerve II-XII: Pupils were equal round reactive to light. Extraocular  movements were full, visual field were  full on confrontational test. Facial sensation and strength were normal. Uvula tongue midline. Head turning and shoulder shrug  were normal and symmetric. Motor: The motor testing reveals 5 over 5 strength of all 4 extremities. Good symmetric motor tone is noted throughout.  Gait and station: Gait is normal.    DIAGNOSTIC DATA (LABS, IMAGING, TESTING) - I reviewed patient records, labs, notes, testing and imaging myself where available.  No results found for: WBC, HGB, HCT, MCV, PLT    Component Value Date/Time   NA 139 05/09/2022 1051   No results found for: CHOL, HDL, LDLCALC, LDLDIRECT, TRIG, CHOLHDL No results found for: UJWJ1B No results found for: VITAMINB12 No results found for: TSH      No data to display               No data to display           ASSESSMENT AND PLAN  68 y.o. year old female  has a past medical history of Headache, Hypertension, and Trigeminal neuralgia. here with    Migraine with aura and without status migrainosus, not intractable  Trigeminal neuralgia - Plan: carbamazepine  (TEGRETOL  XR) 200 MG 12 hr tablet, Carbamazepine  level, total, CMP  Tension  Palpitation  Cardiac arrhythmia, unspecified cardiac arrhythmia type  Dahna reports that trigeminal pain is well managed on carbamazepine . We will continue 200mg  twice daily for now. She discontinued imipramine  about 4 months ago. She will continue sumatriptan  and Tylenol  Tension as needed but advised against regular use of abortive medicaitons. We have discussed considering a sleep eval. No other signs of sleep apnea outside of morning headaches. She will think about this. Consider counseling for mood management. We discussed regular palpitations and auscultation of abnormal rhythm, today. She denies chest pain and no significant shortness of breath but has not felt well since bout with diverticulitis. I have advised she contact  PCP asap to request work in appt to evaluate with EKG. Mother had atrial fib. Red flag warnings discussed and she was advised to seek emergency medical attention if needed. Exercise also encouraged. Healthy lifestyle habits reviewed. She will follow up in 1 year, sooner if needed.   Orders Placed This Encounter  Procedures   Carbamazepine  level, total   CMP     Meds ordered this encounter  Medications   SUMAtriptan  (IMITREX ) 100 MG tablet    Sig: Take 1 tablet (100 mg total) by mouth once as needed for migraine. May repeat x 1 after 2 hours; maximum 2 tabs per day and 8 tabs per month    Dispense:  8 tablet    Refill:  12    Supervising Provider:   AHERN, ANTONIA B [[id number]]   carbamazepine  (TEGRETOL  XR) 200 MG 12 hr tablet    Sig: Take 1 tablet (200 mg total) by mouth 2 (two) times daily.    Dispense:  180 tablet    Refill:  3    Supervising Provider:   AHERN, ANTONIA B [id number]    I spent 30 minutes of face-to-face and non-face-to-face time with patient.  This included previsit chart review, lab review, study review, order entry, electronic health record documentation, patient education.   Terrilyn Fick, MSN, FNP-C 06/25/2023, 11:40 AM  [facility] [address] [place], [place] [address] [phone]

## 2023-06-21 NOTE — Patient Instructions (Addendum)
 Below is our plan:  We will continue carbamazepine  200mg  twice daily. Stop imipramine . Continue sumatriptan  as needed. We will update labs, today. Consider sleep evaluation for sleep apnea. Please call your PCP regarding not feeling well and the palpitations.   Please make sure you are staying well hydrated. I recommend 50-60 ounces daily. Well balanced diet and regular exercise encouraged. Consistent sleep schedule with 6-8 hours recommended.   Please continue follow up with care team as directed.   Follow up with me in 1 year   You may receive a survey regarding today's visit. I encourage you to leave honest feed back as I do use this information to improve patient care. Thank you for seeing me today!   GENERAL HEADACHE INFORMATION:   Natural supplements: Magnesium Oxide or Magnesium Glycinate 500 mg at bed (up to 800 mg daily) Coenzyme Q10 300 mg in AM Vitamin B2- 200 mg twice a day   Add 1 supplement at a time since even natural supplements can have undesirable side effects. You can sometimes buy supplements cheaper (especially Coenzyme Q10) at www.WebmailGuide.co.za or at [facility].  Migraine with aura: There is increased risk for stroke in women with migraine with aura and a contraindication for the combined contraceptive pill for use by women who have migraine with aura. The risk for women with migraine without aura is lower. However other risk factors like smoking are far more likely to increase stroke risk than migraine. There is a recommendation for no smoking and for the use of OCPs without estrogen such as progestogen only pills particularly for women with migraine with aura.Aaron Aas People who have migraine headaches with auras may be 3 times more likely to have a stroke caused by a blood clot, compared to migraine patients who don't see auras. Women who take hormone-replacement therapy may be 30 percent more likely to suffer a clot-based stroke than women not taking medication containing estrogen.  Other risk factors like smoking and high blood pressure may be  much more important.    Vitamins and herbs that show potential:   Magnesium: Magnesium (250 mg twice a day or 500 mg at bed) has a relaxant effect on smooth muscles such as blood vessels. Individuals suffering from frequent or daily headache usually have low magnesium levels which can be increase with daily supplementation of 400-750 mg. Three trials found 40-90% average headache reduction  when used as a preventative. Magnesium may help with headaches are aura, the best evidence for magnesium is for migraine with aura is its thought to stop the cortical spreading depression we believe is the pathophysiology of migraine aura.Magnesium also demonstrated the benefit in menstrually related migraine.  Magnesium is part of the messenger system in the serotonin cascade and it is a good muscle relaxant.  It is also useful for constipation which can be a side effect of other medications used to treat migraine. Good sources include nuts, whole grains, and tomatoes. Side Effects: loose stool/diarrhea  Riboflavin (vitamin B 2) 200 mg twice a day. This vitamin assists nerve cells in the production of ATP a principal energy storing molecule.  It is necessary for many chemical reactions in the body.  There have been at least 3 clinical trials of riboflavin using 400 mg per day all of which suggested that migraine frequency can be decreased.  All 3 trials showed significant improvement in over half of migraine sufferers.  The supplement is found in bread, cereal, milk, meat, and poultry.  Most Americans get more riboflavin  than the recommended daily allowance, however riboflavin deficiency is not necessary for the supplements to help prevent headache. Side effects: energizing, green urine   Coenzyme Q10: This is present in almost all cells in the body and is critical component for the conversion of energy.  Recent studies have shown that a nutritional  supplement of CoQ10 can reduce the frequency of migraine attacks by improving the energy production of cells as with riboflavin.  Doses of 150 mg twice a day have been shown to be effective.   Melatonin: Increasing evidence shows correlation between melatonin secretion and headache conditions.  Melatonin supplementation has decreased headache intensity and duration.  It is widely used as a sleep aid.  Sleep is natures way of dealing with migraine.  A dose of 3 mg is recommended to start for headaches including cluster headache. Higher doses up to 15 mg has been reviewed for use in Cluster headache and have been used. The rationale behind using melatonin for cluster is that many theories regarding the cause of Cluster headache center around the disruption of the normal circadian rhythm in the brain.  This helps restore the normal circadian rhythm.   HEADACHE DIET: Foods and beverages which may trigger migraine Note that only 20% of headache patients are food sensitive. You will know if you are food sensitive if you get a headache consistently 20 minutes to 2 hours after eating a certain food. Only cut out a food if it causes headaches, otherwise you might remove foods you enjoy! What matters most for diet is to eat a well balanced healthy diet full of vegetables and low fat protein, and to not miss meals.   Chocolate, other sweets ALL cheeses except cottage and cream cheese Dairy products, yogurt, sour cream, ice cream Liver Meat extracts (Bovril, Marmite, meat tenderizers) Meats or fish which have undergone aging, fermenting, pickling or smoking. These include: Hotdogs,salami,Lox,sausage, mortadellas,smoked salmon, pepperoni, Pickled herring Pods of broad bean (English beans, Chinese pea pods, [place] (fava) beans, lima and navy beans Ripe avocado, ripe banana Yeast extracts or active yeast preparations such as Brewer's or Fleishman's (commercial bakes goods are permitted) Tomato based foods, pizza  (lasagna, etc.)   MSG (monosodium glutamate) is disguised as many things; look for these common aliases: Monopotassium glutamate Autolysed yeast Hydrolysed protein Sodium caseinate "flavorings" "all natural preservatives Nutrasweet   Avoid all other foods that convincingly provoke headaches.   Resources: The Dizzy Althia Jetty Your Headache Diet, migrainestrong.com  https://zamora-andrews.com/   Caffeine and Migraine For patients that have migraine, caffeine intake more than 3 days per week can lead to dependency and increased migraine frequency. I would recommend cutting back on your caffeine intake as best you can. The recommended amount of caffeine is 200-300 mg daily, although migraine patients may experience dependency at even lower doses. While you may notice an increase in headache temporarily, cutting back will be helpful for headaches in the long run. For more information on caffeine and migraine, visit: https://americanmigrainefoundation.org/resource-library/caffeine-and-migraine/   Headache Prevention Strategies:   1. Maintain a headache diary; learn to identify and avoid triggers.  - This can be a simple note where you log when you had a headache, associated symptoms, and medications used - There are several smartphone apps developed to help track migraines: Migraine Buddy, Migraine Monitor, Curelator N1-Headache App   Common triggers include: Emotional triggers: Emotional/Upset family or friends Emotional/Upset occupation Business reversal/success Anticipation anxiety Crisis-serious Post-crisis periodNew job/position   Physical triggers: Vacation Day Weekend Strenuous Exercise High Altitude  Location New Move Menstrual Day Physical Illness Oversleep/Not enough sleep Weather changes Light: Photophobia or light sesnitivity treatment involves a balance between desensitization and reduction in overly strong input. Use  dark polarized glasses outside, but not inside. Avoid bright or fluorescent light, but do not dim environment to the point that going into a normally lit room hurts. Consider FL-41 tint lenses, which reduce the most irritating wavelengths without blocking too much light.  These can be obtained at axonoptics.com or theraspecs.com Foods: see list above.   2. Limit use of acute treatments (over-the-counter medications, triptans, etc.) to no more than 2 days per week or 10 days per month to prevent medication overuse headache (rebound headache).     3. Follow a regular schedule (including weekends and holidays): Don't skip meals. Eat a balanced diet. 8 hours of sleep nightly. Minimize stress. Exercise 30 minutes per day. Being overweight is associated with a 5 times increased risk of chronic migraine. Keep well hydrated and drink 6-8 glasses of water per day.   4. Initiate non-pharmacologic measures at the earliest onset of your headache. Rest and quiet environment. Relax and reduce stress. Breathe2Relax is a free app that can instruct you on    some simple relaxtion and breathing techniques. Http://Dawnbuse.com is a    free website that provides teaching videos on relaxation.  Also, there are  many apps that   can be downloaded for "mindful" relaxation.  An app called YOGA NIDRA will help walk you through mindfulness. Another app called Calm can be downloaded to give you a structured mindfulness guide with daily reminders and skill development. Headspace for guided meditation Mindfulness Based Stress Reduction Online Course: www.palousemindfulness.com Cold compresses.   5. Don't wait!! Take the maximum allowable dosage of prescribed medication at the first sign of migraine.   6. Compliance:  Take prescribed medication regularly as directed and at the first sign of a migraine.   7. Communicate:  Call your physician when problems arise, especially if your headaches change, increase in  frequency/severity, or become associated with neurological symptoms (weakness, numbness, slurred speech, etc.). Proceed to emergency room if you experience new or worsening symptoms or symptoms do not resolve, if you have new neurologic symptoms or if headache is severe, or for any concerning symptom.   8. Headache/pain management therapies: Consider various complementary methods, including medication, behavioral therapy, psychological counselling, biofeedback, massage therapy, acupuncture, dry needling, and other modalities.  Such measures may reduce the need for medications. Counseling for pain management, where patients learn to function and ignore/minimize their pain, seems to work very well.   9. Recommend changing family's attention and focus away from patient's headaches. Instead, emphasize daily activities. If first question of day is 'How are your headaches/Do you have a headache today?', then patient will constantly think about headaches, thus making them worse. Goal is to re-direct attention away from headaches, toward daily activities and other distractions.   10. Helpful Websites: www.AmericanHeadacheSociety.org PatentHood.ch www.headaches.org TightMarket.nl www.achenet.org

## 2023-06-25 ENCOUNTER — Ambulatory Visit: Payer: PPO | Admitting: Family Medicine

## 2023-06-25 ENCOUNTER — Encounter: Payer: Self-pay | Admitting: Family Medicine

## 2023-06-25 VITALS — BP 132/80 | HR 100 | Ht 63.0 in | Wt 177.5 lb

## 2023-06-25 DIAGNOSIS — R002 Palpitations: Secondary | ICD-10-CM

## 2023-06-25 DIAGNOSIS — I499 Cardiac arrhythmia, unspecified: Secondary | ICD-10-CM

## 2023-06-25 DIAGNOSIS — F489 Nonpsychotic mental disorder, unspecified: Secondary | ICD-10-CM | POA: Diagnosis not present

## 2023-06-25 DIAGNOSIS — G43109 Migraine with aura, not intractable, without status migrainosus: Secondary | ICD-10-CM | POA: Diagnosis not present

## 2023-06-25 DIAGNOSIS — G500 Trigeminal neuralgia: Secondary | ICD-10-CM | POA: Diagnosis not present

## 2023-06-25 MED ORDER — CARBAMAZEPINE ER 200 MG PO TB12
200.0000 mg | ORAL_TABLET | Freq: Two times a day (BID) | ORAL | 3 refills | Status: AC
Start: 2023-06-25 — End: ?

## 2023-06-25 MED ORDER — SUMATRIPTAN SUCCINATE 100 MG PO TABS: 100.0000 mg | ORAL_TABLET | Freq: Once | ORAL | 12 refills | Status: AC | PRN

## 2023-06-26 ENCOUNTER — Ambulatory Visit: Payer: Self-pay | Admitting: Family Medicine

## 2023-06-26 LAB — COMPREHENSIVE METABOLIC PANEL WITH GFR
ALT: 15 IU/L (ref 0–32)
AST: 14 IU/L (ref 0–40)
Albumin: 4.6 g/dL (ref 3.9–4.9)
Alkaline Phosphatase: 76 IU/L (ref 44–121)
BUN/Creatinine Ratio: 15 (ref 12–28)
BUN: 11 mg/dL (ref 8–27)
Bilirubin Total: 0.2 mg/dL (ref 0.0–1.2)
CO2: 21 mmol/L (ref 20–29)
Calcium: 10 mg/dL (ref 8.7–10.3)
Chloride: 102 mmol/L (ref 96–106)
Creatinine, Ser: 0.71 mg/dL (ref 0.57–1.00)
Globulin, Total: 2.9 g/dL (ref 1.5–4.5)
Glucose: 96 mg/dL (ref 70–99)
Potassium: 4.1 mmol/L (ref 3.5–5.2)
Sodium: 140 mmol/L (ref 134–144)
Total Protein: 7.5 g/dL (ref 6.0–8.5)
eGFR: 92 mL/min/{1.73_m2} (ref >59)

## 2023-06-26 LAB — CARBAMAZEPINE LEVEL, TOTAL: Carbamazepine (Tegretol), S: 6.1 ug/mL (ref 4.0–12.0)

## 2024-06-30 ENCOUNTER — Ambulatory Visit: Admitting: Family Medicine
